# Patient Record
Sex: Female | Born: 1945 | Race: White | Hispanic: No | Marital: Married | Smoking: Former smoker
Health system: Southern US, Community
[De-identification: ages and names within clinical notes are randomized; demographics above are authoritative.]

## PROBLEM LIST (undated history)

## (undated) DIAGNOSIS — F32A Depression, unspecified: Secondary | ICD-10-CM

## (undated) DIAGNOSIS — I10 Essential (primary) hypertension: Secondary | ICD-10-CM

## (undated) DIAGNOSIS — F419 Anxiety disorder, unspecified: Secondary | ICD-10-CM

## (undated) DIAGNOSIS — M1990 Unspecified osteoarthritis, unspecified site: Secondary | ICD-10-CM

## (undated) DIAGNOSIS — I639 Cerebral infarction, unspecified: Secondary | ICD-10-CM

## (undated) DIAGNOSIS — I2510 Atherosclerotic heart disease of native coronary artery without angina pectoris: Secondary | ICD-10-CM

## (undated) DIAGNOSIS — G709 Myoneural disorder, unspecified: Secondary | ICD-10-CM

## (undated) DIAGNOSIS — N189 Chronic kidney disease, unspecified: Secondary | ICD-10-CM

## (undated) DIAGNOSIS — F329 Major depressive disorder, single episode, unspecified: Secondary | ICD-10-CM

## (undated) DIAGNOSIS — G4730 Sleep apnea, unspecified: Secondary | ICD-10-CM

## (undated) DIAGNOSIS — E119 Type 2 diabetes mellitus without complications: Secondary | ICD-10-CM

## (undated) DIAGNOSIS — R0602 Shortness of breath: Secondary | ICD-10-CM

## (undated) DIAGNOSIS — Z8489 Family history of other specified conditions: Secondary | ICD-10-CM

## (undated) HISTORY — PX: JOINT REPLACEMENT: SHX530

## (undated) HISTORY — PX: CARDIAC CATHETERIZATION: SHX172

## (undated) HISTORY — PX: CHOLECYSTECTOMY: SHX55

## (undated) HISTORY — PX: ABDOMINAL HYSTERECTOMY: SHX81

---

## 1997-12-04 ENCOUNTER — Ambulatory Visit (HOSPITAL_COMMUNITY): Admission: RE | Admit: 1997-12-04 | Discharge: 1997-12-04 | Payer: Self-pay | Admitting: Neurological Surgery

## 1997-12-04 ENCOUNTER — Encounter: Payer: Self-pay | Admitting: Neurological Surgery

## 1997-12-07 ENCOUNTER — Encounter: Admission: RE | Admit: 1997-12-07 | Discharge: 1998-03-07 | Payer: Self-pay | Admitting: Family Medicine

## 1998-11-10 ENCOUNTER — Ambulatory Visit (HOSPITAL_COMMUNITY): Admission: RE | Admit: 1998-11-10 | Discharge: 1998-11-10 | Payer: Self-pay | Admitting: Family Medicine

## 1998-11-10 ENCOUNTER — Encounter: Payer: Self-pay | Admitting: Family Medicine

## 1999-12-14 ENCOUNTER — Ambulatory Visit (HOSPITAL_COMMUNITY): Admission: RE | Admit: 1999-12-14 | Discharge: 1999-12-14 | Payer: Self-pay | Admitting: Neurological Surgery

## 1999-12-14 ENCOUNTER — Encounter: Payer: Self-pay | Admitting: Neurological Surgery

## 2000-12-18 ENCOUNTER — Ambulatory Visit (HOSPITAL_COMMUNITY): Admission: RE | Admit: 2000-12-18 | Discharge: 2000-12-18 | Payer: Self-pay | Admitting: Neurological Surgery

## 2000-12-18 ENCOUNTER — Encounter: Payer: Self-pay | Admitting: Neurological Surgery

## 2001-01-02 ENCOUNTER — Ambulatory Visit (HOSPITAL_COMMUNITY): Admission: RE | Admit: 2001-01-02 | Discharge: 2001-01-02 | Payer: Self-pay | Admitting: Otolaryngology

## 2001-01-02 ENCOUNTER — Encounter: Payer: Self-pay | Admitting: Otolaryngology

## 2001-07-23 ENCOUNTER — Encounter: Admission: RE | Admit: 2001-07-23 | Discharge: 2001-07-23 | Payer: Self-pay | Admitting: Family Medicine

## 2001-07-23 ENCOUNTER — Encounter: Payer: Self-pay | Admitting: Family Medicine

## 2001-07-26 ENCOUNTER — Encounter: Payer: Self-pay | Admitting: Family Medicine

## 2001-07-26 ENCOUNTER — Encounter: Admission: RE | Admit: 2001-07-26 | Discharge: 2001-07-26 | Payer: Self-pay | Admitting: Family Medicine

## 2001-09-13 ENCOUNTER — Encounter: Admission: RE | Admit: 2001-09-13 | Discharge: 2001-11-19 | Payer: Self-pay | Admitting: Family Medicine

## 2001-10-02 ENCOUNTER — Ambulatory Visit (HOSPITAL_BASED_OUTPATIENT_CLINIC_OR_DEPARTMENT_OTHER): Admission: RE | Admit: 2001-10-02 | Discharge: 2001-10-02 | Payer: Self-pay | Admitting: Family Medicine

## 2002-01-01 ENCOUNTER — Encounter: Admission: RE | Admit: 2002-01-01 | Discharge: 2002-01-01 | Payer: Self-pay

## 2002-01-28 ENCOUNTER — Encounter: Payer: Self-pay | Admitting: Family Medicine

## 2002-01-28 ENCOUNTER — Encounter: Admission: RE | Admit: 2002-01-28 | Discharge: 2002-01-28 | Payer: Self-pay | Admitting: Family Medicine

## 2002-06-12 ENCOUNTER — Encounter: Admission: RE | Admit: 2002-06-12 | Discharge: 2002-06-12 | Payer: Self-pay | Admitting: Family Medicine

## 2002-06-12 ENCOUNTER — Encounter: Payer: Self-pay | Admitting: Family Medicine

## 2002-10-28 ENCOUNTER — Encounter: Admission: RE | Admit: 2002-10-28 | Discharge: 2002-10-28 | Payer: Self-pay

## 2002-11-18 ENCOUNTER — Encounter: Payer: Self-pay | Admitting: Surgery

## 2002-11-21 ENCOUNTER — Encounter (INDEPENDENT_AMBULATORY_CARE_PROVIDER_SITE_OTHER): Payer: Self-pay | Admitting: *Deleted

## 2002-11-21 ENCOUNTER — Ambulatory Visit (HOSPITAL_COMMUNITY): Admission: RE | Admit: 2002-11-21 | Discharge: 2002-11-22 | Payer: Self-pay | Admitting: Surgery

## 2002-12-17 ENCOUNTER — Ambulatory Visit (HOSPITAL_COMMUNITY): Admission: RE | Admit: 2002-12-17 | Discharge: 2002-12-17 | Payer: Self-pay | Admitting: Neurological Surgery

## 2003-12-17 ENCOUNTER — Ambulatory Visit (HOSPITAL_COMMUNITY): Admission: RE | Admit: 2003-12-17 | Discharge: 2003-12-17 | Payer: Self-pay | Admitting: Neurological Surgery

## 2004-01-15 ENCOUNTER — Inpatient Hospital Stay (HOSPITAL_COMMUNITY): Admission: AD | Admit: 2004-01-15 | Discharge: 2004-01-17 | Payer: Self-pay | Admitting: Cardiovascular Disease

## 2004-03-24 ENCOUNTER — Encounter (HOSPITAL_COMMUNITY): Admission: RE | Admit: 2004-03-24 | Discharge: 2004-06-22 | Payer: Self-pay | Admitting: Cardiovascular Disease

## 2004-06-15 ENCOUNTER — Inpatient Hospital Stay (HOSPITAL_COMMUNITY): Admission: EM | Admit: 2004-06-15 | Discharge: 2004-06-17 | Payer: Self-pay | Admitting: Emergency Medicine

## 2004-06-23 ENCOUNTER — Encounter (HOSPITAL_COMMUNITY): Admission: RE | Admit: 2004-06-23 | Discharge: 2004-07-29 | Payer: Self-pay | Admitting: Cardiovascular Disease

## 2004-12-14 ENCOUNTER — Ambulatory Visit: Payer: Self-pay | Admitting: Physical Medicine & Rehabilitation

## 2004-12-14 ENCOUNTER — Inpatient Hospital Stay (HOSPITAL_COMMUNITY): Admission: RE | Admit: 2004-12-14 | Discharge: 2004-12-19 | Payer: Self-pay | Admitting: Orthopedic Surgery

## 2005-04-25 ENCOUNTER — Ambulatory Visit: Payer: Self-pay | Admitting: Cardiology

## 2005-04-26 ENCOUNTER — Inpatient Hospital Stay (HOSPITAL_COMMUNITY): Admission: EM | Admit: 2005-04-26 | Discharge: 2005-04-27 | Payer: Self-pay | Admitting: Emergency Medicine

## 2005-06-21 ENCOUNTER — Encounter: Admission: RE | Admit: 2005-06-21 | Discharge: 2005-06-21 | Payer: Self-pay | Admitting: Orthopedic Surgery

## 2005-07-06 ENCOUNTER — Encounter: Admission: RE | Admit: 2005-07-06 | Discharge: 2005-07-06 | Payer: Self-pay | Admitting: Orthopedic Surgery

## 2005-12-13 ENCOUNTER — Ambulatory Visit (HOSPITAL_COMMUNITY): Admission: RE | Admit: 2005-12-13 | Discharge: 2005-12-13 | Payer: Self-pay | Admitting: Neurological Surgery

## 2006-03-15 ENCOUNTER — Inpatient Hospital Stay (HOSPITAL_COMMUNITY): Admission: EM | Admit: 2006-03-15 | Discharge: 2006-03-16 | Payer: Self-pay | Admitting: Emergency Medicine

## 2006-10-04 ENCOUNTER — Ambulatory Visit (HOSPITAL_COMMUNITY): Admission: RE | Admit: 2006-10-04 | Discharge: 2006-10-05 | Payer: Self-pay | Admitting: Urology

## 2006-10-25 ENCOUNTER — Inpatient Hospital Stay (HOSPITAL_COMMUNITY): Admission: EM | Admit: 2006-10-25 | Discharge: 2006-10-29 | Payer: Self-pay | Admitting: Emergency Medicine

## 2007-02-05 ENCOUNTER — Encounter: Admission: RE | Admit: 2007-02-05 | Payer: Self-pay | Admitting: Physician Assistant

## 2007-05-02 ENCOUNTER — Encounter: Admission: RE | Admit: 2007-05-02 | Discharge: 2007-05-02 | Payer: Self-pay | Admitting: Family Medicine

## 2007-09-18 ENCOUNTER — Ambulatory Visit (HOSPITAL_COMMUNITY): Admission: RE | Admit: 2007-09-18 | Discharge: 2007-09-18 | Payer: Self-pay | Admitting: Gastroenterology

## 2007-12-19 ENCOUNTER — Ambulatory Visit (HOSPITAL_COMMUNITY): Admission: RE | Admit: 2007-12-19 | Discharge: 2007-12-19 | Payer: Self-pay | Admitting: Neurological Surgery

## 2008-04-07 ENCOUNTER — Observation Stay (HOSPITAL_COMMUNITY): Admission: EM | Admit: 2008-04-07 | Discharge: 2008-04-08 | Payer: Self-pay | Admitting: Emergency Medicine

## 2008-04-07 HISTORY — PX: CARDIAC CATHETERIZATION: SHX172

## 2009-02-09 ENCOUNTER — Encounter: Admission: RE | Admit: 2009-02-09 | Discharge: 2009-02-09 | Payer: Self-pay | Admitting: Internal Medicine

## 2009-03-08 ENCOUNTER — Inpatient Hospital Stay (HOSPITAL_COMMUNITY): Admission: RE | Admit: 2009-03-08 | Discharge: 2009-03-10 | Payer: Self-pay | Admitting: Orthopedic Surgery

## 2010-04-24 LAB — COMPREHENSIVE METABOLIC PANEL
ALT: 35 U/L (ref 0–35)
AST: 25 U/L (ref 0–37)
CO2: 29 mEq/L (ref 19–32)
Chloride: 101 mEq/L (ref 96–112)
Creatinine, Ser: 1.34 mg/dL — ABNORMAL HIGH (ref 0.4–1.2)
GFR calc Af Amer: 48 mL/min — ABNORMAL LOW (ref >60)
GFR calc non Af Amer: 40 mL/min — ABNORMAL LOW (ref >60)
Glucose, Bld: 112 mg/dL — ABNORMAL HIGH (ref 70–99)
Sodium: 139 mEq/L (ref 135–145)
Total Bilirubin: 0.4 mg/dL (ref 0.3–1.2)

## 2010-04-24 LAB — ABO/RH: ABO/RH(D): O NEG

## 2010-04-24 LAB — URINE CULTURE
Colony Count: NO GROWTH
Culture: NO GROWTH

## 2010-04-24 LAB — URINALYSIS, ROUTINE W REFLEX MICROSCOPIC
Glucose, UA: NEGATIVE mg/dL
Hgb urine dipstick: NEGATIVE
Ketones, ur: NEGATIVE mg/dL
Nitrite: NEGATIVE
Protein, ur: NEGATIVE mg/dL
Specific Gravity, Urine: 1.025 (ref 1.005–1.030)
Urobilinogen, UA: 1 mg/dL (ref 0.0–1.0)
pH: 5.5 (ref 5.0–8.0)

## 2010-04-24 LAB — GLUCOSE, CAPILLARY
Glucose-Capillary: 103 mg/dL — ABNORMAL HIGH (ref 70–99)
Glucose-Capillary: 133 mg/dL — ABNORMAL HIGH (ref 70–99)
Glucose-Capillary: 137 mg/dL — ABNORMAL HIGH (ref 70–99)
Glucose-Capillary: 168 mg/dL — ABNORMAL HIGH (ref 70–99)

## 2010-04-24 LAB — CBC
Hemoglobin: 14.8 g/dL (ref 12.0–15.0)
MCV: 85.4 fL (ref 78.0–100.0)
RBC: 5.02 MIL/uL (ref 3.87–5.11)
WBC: 7.5 10*3/uL (ref 4.0–10.5)

## 2010-04-24 LAB — DIFFERENTIAL
Basophils Absolute: 0 10*3/uL (ref 0.0–0.1)
Eosinophils Absolute: 0.2 10*3/uL (ref 0.0–0.7)
Eosinophils Relative: 2 % (ref 0–5)
Neutrophils Relative %: 57 % (ref 43–77)

## 2010-04-24 LAB — APTT: aPTT: 27 seconds (ref 24–37)

## 2010-04-24 LAB — TYPE AND SCREEN
ABO/RH(D): O NEG
Antibody Screen: NEGATIVE

## 2010-04-24 LAB — PROTIME-INR: Prothrombin Time: 13.6 seconds (ref 11.6–15.2)

## 2010-04-27 LAB — CBC
Hemoglobin: 10.2 g/dL — ABNORMAL LOW (ref 12.0–15.0)
Hemoglobin: 9.5 g/dL — ABNORMAL LOW (ref 12.0–15.0)
MCHC: 33.1 g/dL (ref 30.0–36.0)
MCHC: 34.1 g/dL (ref 30.0–36.0)
MCV: 84.8 fL (ref 78.0–100.0)
MCV: 87.2 fL (ref 78.0–100.0)
RBC: 3.53 MIL/uL — ABNORMAL LOW (ref 3.87–5.11)
RDW: 14.3 % (ref 11.5–15.5)

## 2010-04-27 LAB — GLUCOSE, CAPILLARY
Glucose-Capillary: 110 mg/dL — ABNORMAL HIGH (ref 70–99)
Glucose-Capillary: 125 mg/dL — ABNORMAL HIGH (ref 70–99)
Glucose-Capillary: 128 mg/dL — ABNORMAL HIGH (ref 70–99)
Glucose-Capillary: 132 mg/dL — ABNORMAL HIGH (ref 70–99)

## 2010-04-27 LAB — BASIC METABOLIC PANEL
CO2: 29 mEq/L (ref 19–32)
CO2: 30 mEq/L (ref 19–32)
Calcium: 8.4 mg/dL (ref 8.4–10.5)
Chloride: 100 mEq/L (ref 96–112)
Chloride: 102 mEq/L (ref 96–112)
Creatinine, Ser: 1.15 mg/dL (ref 0.4–1.2)
GFR calc Af Amer: >60 mL/min (ref >60)
Glucose, Bld: 124 mg/dL — ABNORMAL HIGH (ref 70–99)
Potassium: 4.2 mEq/L (ref 3.5–5.1)
Sodium: 134 mEq/L — ABNORMAL LOW (ref 135–145)
Sodium: 136 mEq/L (ref 135–145)

## 2010-04-27 LAB — PROTIME-INR: INR: 1.71 — ABNORMAL HIGH (ref 0.00–1.49)

## 2010-05-19 LAB — COMPREHENSIVE METABOLIC PANEL
ALT: 24 U/L (ref 0–35)
AST: 20 U/L (ref 0–37)
Albumin: 3.9 g/dL (ref 3.5–5.2)
Alkaline Phosphatase: 43 U/L (ref 39–117)
Chloride: 104 mEq/L (ref 96–112)
GFR calc Af Amer: 57 mL/min — ABNORMAL LOW (ref >60)
Potassium: 3.8 mEq/L (ref 3.5–5.1)
Sodium: 138 mEq/L (ref 135–145)
Total Bilirubin: 0.5 mg/dL (ref 0.3–1.2)
Total Protein: 6.7 g/dL (ref 6.0–8.3)

## 2010-05-19 LAB — CBC
HCT: 39.2 % (ref 36.0–46.0)
Hemoglobin: 13.6 g/dL (ref 12.0–15.0)
RBC: 4.85 MIL/uL (ref 3.87–5.11)
RDW: 16.3 % — ABNORMAL HIGH (ref 11.5–15.5)
WBC: 6 10*3/uL (ref 4.0–10.5)

## 2010-05-19 LAB — DIFFERENTIAL
Basophils Absolute: 0 10*3/uL (ref 0.0–0.1)
Eosinophils Relative: 3 % (ref 0–5)
Lymphocytes Relative: 39 % (ref 12–46)
Lymphs Abs: 2.3 10*3/uL (ref 0.7–4.0)
Neutro Abs: 2.9 10*3/uL (ref 1.7–7.7)
Neutrophils Relative %: 49 % (ref 43–77)

## 2010-05-19 LAB — PROTIME-INR
INR: 1 (ref 0.00–1.49)
Prothrombin Time: 13.4 seconds (ref 11.6–15.2)

## 2010-05-19 LAB — TROPONIN I: Troponin I: <0.01 ng/mL (ref 0.00–0.06)

## 2010-05-19 LAB — GLUCOSE, CAPILLARY: Glucose-Capillary: 311 mg/dL — ABNORMAL HIGH (ref 70–99)

## 2010-05-19 LAB — TSH: TSH: 1.535 u[IU]/mL (ref 0.350–4.500)

## 2010-05-19 LAB — APTT: aPTT: 26 seconds (ref 24–37)

## 2010-05-19 LAB — CK TOTAL AND CKMB (NOT AT ARMC)
CK, MB: 2.1 ng/mL (ref 0.3–4.0)
Total CK: 98 U/L (ref 7–177)

## 2010-06-21 NOTE — Cardiovascular Report (Signed)
[name], [name] NO.:  [id number]   MEDICAL RECORD NO.:  [id number]          PATIENT TYPE:  INP   LOCATION:  4738                         FACILITY:  [facility]   PHYSICIAN:  Darlin Priestly, MD  DATE OF BIRTH:  1945/08/28   DATE OF PROCEDURE:  10/29/2006  DATE OF DISCHARGE:                            CARDIAC CATHETERIZATION   PROCEDURES:  1. Left heart catheterization.  2. Coronary angiography.  3. Left ventriculogram.   ATTENDING:  Dr. Lenise Herald   COMPLICATIONS:  None.   INDICATION:  [name] is a 65 year old female patient of Dr. Smith Mince  and Dr. Nanetta Batty with a history of ongoing tobacco use, history of  diabetes, history of RCA stenting in 2005 with scattered noncritical  disease of the circumflex and LAD.  She was readmitted on October 25, 2006 with recurrent chest pain which happened with exertion throughout  her hospital stay.  She is now brought for repeat catheterization to  reassess her coronary artery disease.   DESCRIPTION OF OPERATION:  After obtaining informed consent, the patient  was brought to the cardiac cath lab where the right groin was shaved,  prepped and draped in a sterile fashion.  Anesthesia monitor  established.  Utilizing the modified Seldinger technique, a #6  intraarterial sheath was inserted into the right femoral artery.  A 6-  [place] diagnostic catheter was then used to perform diagnostic  angiography.   The left main is a large vessel with no evidence of disease.   The LAD is a medium-to-large vessel which courses to gives rise to two  diagonal branches.  LAD has mild 30% proximal and 30% mid-vessel  stenosis, but no high-grade lesions.   First and second diagonals are small-to-medium-size vessels with no  significant disease.   The left coronary artery also gives rise to a medium-size ramus  intermedius which bifurcates distally with no significant disease.   The left circumflex is a medium-size  vessel which courses to the AV  groove and gives rise to one obtuse marginal branch.  In the AV groove  circumflex, there is no significant disease.   The first OM is a small-to-medium size vessel which bifurcates distally  with no significant disease.   The right coronary artery is a large vessel which is dominant and gives  rise to both PDA and posterolateral branch.  There is a stent noted in  the distal portion of the RCA.  The mid-RCA has a diffuse 50% long  stenotic lesion which is essentially unchanged.  There is a patent stent  in the distal RCA.  The PDA has no significant disease.   The PLA is a medium-size vessel with a 50% lesion in the mid and distal  segment.   The left ventriculogram reveals a mildly depressed EF of 45% with mild  anterolateral hypokinesis.   HEMODYNAMIC SYSTEM:  Systemic arterial pressure 140/72, LV systemic  pressure 140/8, LVEDP of 14.   CONCLUSION:  1. No significant coronary artery disease with widely patent distal      RCA stent.  2. Mildly  depressed left ventricular systolic function.      Darlin Priestly, MD  Electronically Signed     RHM/MEDQ  D:  10/29/2006  T:  10/29/2006  Job:  [id number]   cc:   Nanetta Batty, M.D.  Talmadge Coventry, M.D.

## 2010-06-21 NOTE — Cardiovascular Report (Signed)
[name], Janet NO.:  [id number]   MEDICAL RECORD NO.:  [id number]          PATIENT TYPE:  OBV   LOCATION:  4729                         FACILITY:  [facility]   PHYSICIAN:  Nanetta Batty, M.D.   DATE OF BIRTH:  April 19, 1945   DATE OF PROCEDURE:  04/07/2008  DATE OF DISCHARGE:                            CARDIAC CATHETERIZATION   HISTORY:  [name] is a 65 year old mildly overweight married white  female with history of CAD status post RCA stenting by myself on  January 15, 2004 with a Liberte 4.5 x 16 mm bare metal stent.  Other  problems include history of middle cerebral artery aneurysm which is  stable, hypertension, hyperlipidemia, non-insulin requiring diabetes,  and continued tobacco abuse.  Her last cath performed Dr. Jenne Campus on  October 29, 2006 revealed a widely patent stent.  She came to the ER  today complaining of crescendo angina.  Her enzymes were negative.  Her  EKG showed no acute changes.  She was put on IV heparin and nitro and  presents now for diagnostic coronary arteriography to define her anatomy  and rule out ischemic etiology.   DESCRIPTION OF PROCEDURE:  The patient was brought to the Second Floor  [facility] in the postabsorptive state.  She was not  premedicated.  Her right groin was prepped and shaved in the usual  sterile fashion.  Xylocaine 1% was used for local anesthesia.  A 6-  French sheath was inserted into the right femoral artery using standard  Seldinger technique.  A 6-French right and left Judkins diagnostic  catheter as well as 6-French pigtail catheter were used for selective  cholangiography and left ventriculography respectively.  Visipaque dye  was used for the entirety of the case.  Retrograde aortic, left  ventricular, and pullback pressures were recorded.   HEMODYNAMICS:  1. Aortic systolic pressure 167, diastolic pressure 78.  2. Left ventricular systolic pressure 167, end-diastolic pressure  11.   SELECTIVE CHOLANGIOGRAPHY:  1. Left main normal.  2. LAD; LAD had approximately 30% proximal stenosis at the takeoff of      the first septal perforator.  3. Left circumflex; nondominant and free of significant disease.  4. Ramus branch; moderate in size and free of significant disease.  5. Right coronary; dominant with a widely patent stent in its      midportion.  There was a 40% segmental stenosis just proximal to      the stented segment.  6. Left ventriculography; RAO left ventriculogram was performed using      25 mL of Visipaque dye at 12 mL per second.  The overall LVEF was      estimated to be greater than 60% without focal wall motion      abnormalities.   IMPRESSION:  [name] has widely patent stent with noncritical coronary  artery disease, unchanged from last catheterization, October 29, 2006.  I believe her chest pain is noncardiac.  She will be treated empirically  with antireflux measures.   The ACT was measured and the sheath was removed.  Pressure was held on  the groin to achieve hemostasis.  The patient left the lab in stable  condition.      Nanetta Batty, M.D.  Electronically Signed     JB/MEDQ  D:  04/07/2008  T:  04/08/2008  Job:  [id number]   cc:   Second Floor [facility]  [facility]

## 2010-06-21 NOTE — Discharge Summary (Signed)
[name], MISHKIN NO.:  [id number]   MEDICAL RECORD NO.:  [id number]          PATIENT TYPE:  OBV   LOCATION:  4729                         FACILITY:  [facility]   PHYSICIAN:  Nanetta Batty, M.D.   DATE OF BIRTH:  02-Dec-1945   DATE OF ADMISSION:  04/07/2008  DATE OF DISCHARGE:  04/08/2008                               DISCHARGE SUMMARY   DISCHARGE DIAGNOSES:  1. Chest pain, worrisome for unstable angina, catheterization in this      admission showing no progression of coronary artery disease.  2. Coronary artery disease with remote bare-metal stenting to the      right coronary artery in December 2005, with multiple relook since,      the last one being this admission.  3. Preserved left ventricular function with an ejection fraction of 45-      50%.  4. Treated dyslipidemia.  5. Non-insulin-dependent diabetes.  6. Treated hypertension.  7. History of smoking.  8. History of depression and anxiety, currently under some social      stress.  9. Degenerative disk disease in her lower back followed by Dr. Danielle Dess.  10.Known middle cerebral artery aneurysm followed by Dr. Danielle Dess, which      has been stable.  11.Incidental finding of her right frontal cerebrovascular accident by      CT recently without symptoms.  12.Possible transient ischemic attack with a negative workup in March      2007.   HOSPITAL COURSE:  Ms. Goldberger is a 65 year old female followed by Dr.  Allyson Sabal and Dr. Thayer Headings.  She was cath'd in December 2005, and  underwent a bare-metal stenting to a 70-80% RCA stenosis, she had no  other significant disease.  She had a followup nuclear study later in  December of that same year that was negative for ischemia.  In May 2006,  she was restudied for recurrent chest pain and the site was patent.  In  March 2007, she was admitted with a TIA.  In February 2008, she was  restudied again for chest pain and had a 40-50% narrowing in the RCA  proximal of  the stent, but otherwise no significant coronary artery  disease.  In September 2008, she was studied once again for chest pain.  The RCA stenosis was unchanged.  There was also noted to be a 50% PLA,  but no critical disease and no in-stent restenosis.  She was admitted to  the emergency room on April 07, 2008, with complaints of substernal chest  pressure that had been going on for a couple of days.  She also said she  had some tingling and numbness in her left arm.  She took a  nitroglycerin with some relief of her symptoms.  She called the office  and was sent to the emergency room.  She admits to being under a lot of  stress, apparently she is currently in a custody battle trying to get  control of one of her grandchildren.  The patient was seen by Dr. Allyson Sabal  and myself in the emergency room.  She was admitted to telemetry and  started on heparin and nitrates.  Dr. Allyson Sabal felt the best way to  evaluate her further was with diagnostic catheterization and she was  taken to the cath lab later on April 07, 2008.  This revealed no in-stent  restenosis of the RCA, 40% proximal narrowing in the RCA to the stent  and no significant disease in the left system and normal LV function.  She tolerated the procedure well.  We feel, she can be discharged on  April 08, 2008.   LABORATORIES:  TSH is 1.53 and INR 1.0.  Troponin is negative.  Sodium  138, potassium 3.8, BUN 19, creatinine 1.16.  LFTs are normal.  White  count 6.0, hemoglobin 13.6, hematocrit 39.2, and platelets 286.  EKG  shows sinus rhythm without acute changes.   DISCHARGE MEDICATIONS:  These were reviewed in detail with the patient.  They are as follows, the patient takes:  1. Alprazolam 1 mg nightly and 0.5 mg t.i.d. p.r.n.  2. She takes BuSpar 40 mg twice a day, this is higher than the      recommended dose and we have asked her to check with Dr. Madaline Guthrie      about this.  She also takes clonazepam 0.5 mg b.i.d. and 1 mg       nightly.  3. Advair Diskus has been increased to twice a day.  4. She takes Astelin spray 2 puffs in each nostril p.r.n.  5. Atenolol 100 mg twice a day.  6. Effexor 37.5 mg 3 tablets daily.  7. Wellbutrin 150 mg a day.  8. Hydrochlorothiazide 25 mg a day.  9. Lipitor 40 mg a day.  10.Lovaza 1 g b.i.d.  11.TriCor 145 mg a day.  12.Singulair 10 mg a day.  13.Ramipril 20 mg a day.  14.Folic acid 1 mg a day.  15.Plavix 75 mg a day.  16.Imdur 60 mg a day.  17.Aspirin 81 mg a day.  18.Nitroglycerin 0.4 mg as needed.  19.We have asked her to also start Pepcid AC 20 mg daily.   She will hold off on her Janumet until Friday, this was 50/1 g b.i.d.   DISPOSITION:  The patient is discharged in stable condition.  She will  follow up with Dr. Thea Silversmith.  She knows to call Dr. Corinna Lines office  and ask about the BuSpar dosing.  She says, she has been on this dose  for quite some time.  She will see Dr. Allyson Sabal back in a few weeks.  We  did counsel her about smoking, she has been on Chantix in the past, but  says she can afford this and actually is not interested in quitting at  this time.      Abelino Derrick, P.A.      Nanetta Batty, M.D.  Electronically Signed    LKK/MEDQ  D:  04/08/2008  T:  04/08/2008  Job:  [id number]   cc:   Clyde Canterbury, MD  Dr. Ronne Binning

## 2010-06-21 NOTE — Op Note (Signed)
[name], [name]                   ACCOUNT NO.:  [id number]   MEDICAL RECORD NO.:  [id number]          PATIENT TYPE:  OIB   LOCATION:  0098                         FACILITY:  [facility]   PHYSICIAN:  Sigmund I. Patsi Sears, M.D.DATE OF BIRTH:  06/22/1945   DATE OF PROCEDURE:  DATE OF DISCHARGE:                               OPERATIVE REPORT   PREOPERATIVE DIAGNOSIS:  Stress urinary incontinence.   POSTOPERATIVE DIAGNOSIS:  Stress urinary incontinence.   PROCEDURE PERFORMED:  Cystoscopy, suprapubic tension-free vaginal tape.   SURGEON:  Sigmund I. Patsi Sears, M.D.   ASSISTANT:  Melina Schools, M.D.   ANESTHESIA:  General.   INDICATIONS FOR PROCEDURE:  [name] is a 65 year old female with a  history of stress urinary incontinence.  Her bother is significant.  She  has no urge symptoms.  She has been tried on conservative management,  include Kegel exercises, and continues to have significant stress  urinary incontinence symptoms and desires operative movement.   DESCRIPTION OF PROCEDURE IN DETAIL:  The patient was brought to the  operating room.  She was identified by his arm band.  Informed consent  was verified, and a preoperative time-out was performed.  Preoperative  antibiotics were administered.  After the successful induction of  general endotracheal anesthesia, the patient was moved to the dorsal  lithotomy position.  All appropriate pressure points were padded to  avoid apraxia or compartment syndrome.  Sequential compression devices  were employed.  The perineum was prepped and draped in the usual  fashion.  A Foley catheter was inserted transurethrally into the  bladder.  A weighted speculum was placed into the vagina.  A T clamp was  placed in the mid urethra.  A 3 cm incision was made in the anterior  vaginal wall overlying the mid urethra.  Strully scissors were used to  develop the plane.  We then perforated the endopelvic fascia posterior  to the pubis.  We then made  stab incisions overlying the pubic bone in  the suprapubic location.  We then used the Linx sling device.  The  curved passers were passed posterior to the pubis into our dissection.  We then removed the Foley catheter and performed cystoscopy with 70 and  30 degree lenses.  Bilateral ureteral orifices were noted to be in  normal anatomic position.  There was no evidence of perforation or  submucosal passage of the needles.  We then attached the sling and  retracted the needles.  Right angle was used to assure adequate tension  on the tape.  We then removed the sheath.  We then amputated the excess  tape.  The wound was copiously irrigated.  A pack was placed into the  vagina.  The Foley catheter was left to straight drain.  The mucosa was  closed with running 2-0 Vicryl.  Dermabond was used to close the  suprapubic stab incisions.  At this time, the procedure was terminated.  The patient tolerated the procedure well, and there were no  complications.   Dr. Jethro Bolus was the attending primary responsible physician  and  was present and participated in all aspects of the procedure.   DISPOSITION:  Patient was awakened from anesthesia and transferred  safely to the PACU.  She will be admitted for 23-hour observation.      Terie Purser, MD      Sigmund I. Patsi Sears, M.D.  Electronically Signed    JH/MEDQ  D:  10/04/2006  T:  10/05/2006  Job:  [id number]

## 2010-06-21 NOTE — H&P (Signed)
[name], [name] NO.:  [id number]   MEDICAL RECORD NO.:  [id number]          PATIENT TYPE:  OBV   LOCATION:  1823                         FACILITY:  [facility]   PHYSICIAN:  [name], M.D.   DATE OF BIRTH:  1945/11/20   DATE OF ADMISSION:  04/07/2008  DATE OF DISCHARGE:                              HISTORY & PHYSICAL   CHIEF COMPLAINT:  Chest pain.   HISTORY OF PRESENT ILLNESS:  Ms. [name] is a 65 year old female followed  by Dr. Allyson [name].  She had a catheterization electively in December 2005 and  underwent Liberte bare metal stenting to a 70-80% RCA stenosis.  She had  a followup nuclear study later in December that was negative for  ischemia.  She had recurrent chest pain in May 2006 and was restudied  and the site was patent.  She also had a CT that ruled out pulmonary  embolism during that admission.  In March 2007 she was admitted with  transient left facial numbness, a workup was negative at that time.  In  February 2008 she was restudied again for chest pain.  She had a 40-50%  RCA narrowing proximal to the stent but otherwise no significant  stenosis.  In September 2008 she was studied again for chest pain.  This  revealed a 50% RCA and a 50% PLA but no significant in-stent restenosis  of the RCA stent and no other disease on the left.  It has been about a  year since she saw Dr. Allyson [name] last.  She is actually done well from a  cardiac standpoint until recently.  She says about 4 days ago she  started having substernal chest pressure.  Her symptoms have been off  and on.  The last day or so she has had some tingling and numbness in  her left arm and some discomfort up into her left jaw.  She called the  office today and the nurse asked her to take a nitroglycerin and the  patient says that it has helped her symptoms.  The patient admits to  being under a lot of stress recently, she is currently in a custody  battle trying to get control of one of her  grandchildren.   PAST MEDICAL HISTORY:  Remarkable for severe anxiety and depression, she  has been followed by a psychiatrist in the past.  She has a history of  DJD and has been followed by Dr. Danielle [name], recently she has had some low  back issues.  She has had a previous right total knee replacement in  November 2006.  She does have a history of a bladder prolapse and had a  cystoscopy in August 2008.  She has non-insulin-dependent diabetes and  hypertension and dyslipidemia.  She has a middle cerebral artery  aneurysm that has been unchanged in 12 years, this is followed by Dr.  Danielle [name].  She has had a previous laparoscopic cholecystectomy in October  2004.   CURRENT MEDICATIONS:  1. Alprazolam 1 mg q.8 h. p.r.n.  2. BuSpar 40 mg h.s.  3. Clonazepam 0.5  mg b.i.d. and 1 mg h.s.  4. Advair Diskus 250 mcg daily.  5. Astelin spray 2 puffs in each nostril p.r.n.  6. Atenolol 50 mg q.h.s.  7. Effexor 37.5 mg 3 tablets daily.  8. Wellbutrin 150 mg 1 tablet daily.  9. Janumet 50/1 g b.i.d.  10.HCTZ 25 mg a day.  11.Lipitor 40 mg a day.  12.Lovaza 1 g b.i.d.  13.Tricor 145 mg a day.  14.Singulair 10 mg a day.  15.Ramipril 20 mg a day.  16.Folic acid daily.  17.Plavix 75 mg a day.  18.Imdur 60 mg a day.  19.Aspirin 81 mg a day.   She reportedly is allergic to [facility] and MACRODANTIN.   SOCIAL HISTORY:  She is married.  She has 3 children and 2  grandchildren.  She continues to smoke about a half-pack a day.   FAMILY HISTORY:  Family history remarkable that her brother had bypass  surgery.   REVIEW OF SYSTEMS:  The patient recently had a CT of her brain November  2009 that showed a new area of right frontal stroke.  The aneurysm is  stable.  She has recently had some low back problems and has seen Dr.  Danielle [name].  She has had a negative mammogram in March 2009.  She had a  negative gastric emptying scan in August 2009.  She denies any GI  bleeding or melena.  She had not had recent  fever or chills.   PHYSICAL EXAMINATION:  Blood pressure 156/76, pulse 67, respirations 12.  GENERAL:  She is a well-developed, well-nourished female in no acute  distress.  HEENT: Normocephalic.  She wears glasses.  NECK:  Without JVD or bruit.  CHEST:  Clear to auscultation and percussion.  CARDIAC:  Reveals regular rate and rhythm without murmur, rub or gallop.  Normal S1, S2.  ABDOMEN:  Nontender.  No hepatosplenomegaly.  EXTREMITIES:  Without edema.  Distal pulses are 1-2+ bilaterally.  There  are no femoral artery bruits noted.  NEUROLOGIC:  Grossly intact.  She is awake, alert and oriented and  cooperative.  Moves all extremities without obvious deficit.  SKIN:  Warm and dry.   EKG showed sinus rhythm, sinus bradycardia, without acute changes.   IMPRESSION:  1. Chest pain consistent with unstable angina.  2. Known coronary disease as described above with remote bare metal      stenting to the right coronary artery January 15, 2004, with      multiple re-looks since showing moderate progression of disease but      no significant in-stent restenosis, last catheterization was      September 2008.  3. Preserved left ventricular function with an ejection fraction of 45-      50%.  4. Treated dyslipidemia.  5. Noninsulin-dependent diabetes.  6. Treated hypertension.  7. Smoking.  8. Severe depression and anxiety with current situational stress.  9. Degenerative joint disease of the lower back.  10.Known middle cerebral artery aneurysm followed by Dr. Danielle [name], this      has been stable.  11.Right frontal cerebrovascular accident by CT, unknown onset.  12.Possible transient ischemic attack March 2007.   PLAN:  The patient will be seen by Dr. Allyson [name].  It is possible we may be  able to do her catheterization today; if not, we will do it tomorrow.  She will need treatment for unstable angina, IV heparin and nitrates.      [name], P.A.      [name], M.D.  Electronically Signed    LKK/MEDQ  D:  04/07/2008  T:  04/07/2008  Job:  [id number]

## 2010-06-24 NOTE — Op Note (Signed)
[name], [name] NO.:  [id number]   MEDICAL RECORD NO.:  [id number]          PATIENT TYPE:  INP   LOCATION:  2899                         FACILITY:  [facility]   PHYSICIAN:  Elana Alm. Thurston Hole, M.D. DATE OF BIRTH:  1945-03-29   DATE OF PROCEDURE:  12/14/2004  DATE OF DISCHARGE:                                 OPERATIVE REPORT   PREOPERATIVE DIAGNOSIS:  Right knee degenerative joint disease.   POSTOPERATIVE DIAGNOSIS:  Right knee degenerative joint disease.   PROCEDURE:  Right total knee replacement using DePuy cemented total knee  system with #3 cemented femur, #3 cemented tibia, with 10 mm polyethylene RP  tibial spacer and 32 mm polyethylene cemented patella.   SURGEON:  Elana Alm. Thurston Hole, M.D.   ASSISTANT:  Julien Girt, P.A.   ANESTHESIA:  General.   OPERATIVE TIME:  One hour 40 minutes.   COMPLICATIONS:  None.   DESCRIPTION OF PROCEDURE:  Ms. Polack was brought to operating room on  December 14, 2004, after a femoral nerve block had been placed in the  holding room by anesthesia. She was placed on operating table supine  position. After being placed under general anesthesia, she had a Foley  catheter placed under sterile conditions and received Ancef 1 g IV  preoperatively for prophylaxis.  Her right knee was examined. She had range  of motion from -8 to 125 degrees with moderate varus deformity, knee stable  ligamentous exam.  Her right leg was then prepped using sterile DuraPrep and  draped using sterile technique. Originally, the leg was exsanguinated and a  thigh tourniquet elevated 375 mm. Initially through a 15 cm longitudinal  incision based over the patella, initial exposure was made.  The underlying  subcutaneous tissues were incised along with skin incision. A median  arthrotomy was performed revealing an excessive amount of normal-appearing  joint fluid. The articular surfaces were inspected. She had grade IV changes  medially, grade  III changes laterally, grade III and IV changes in the  patellofemoral joint. The medial and lateral meniscal remnants were removed  as well as the anterior cruciate ligament. Osteophytes were moved off the  femoral condyles and tibial plateaus. An intramedullary drill was then  drilled up the femoral canal for placement of the distal femoral cutting jig  which was placed in the appropriate manner rotation and a distal 12 mm cut  was made. The distal femur was incised. A #3 was found be the appropriate  size. A #3 cutting jig was placed and then these cuts were made. The  proximal tibia was then exposed. The tibial spines were removed with an  oscillating saw. Intramedullary drill was drilled down the tibial canal for  placement of the proximal tibial cutting jig which was placed in the  appropriate amount of rotation and a proximal 6 mm cut was made based off  the medial or lower side. After this was done, then spacer blocks were  placed in flexion and extension, 10 mm blocks gave excellent stability and  excellent balancing. At this point, the proximal tibia was  again exposed and  the #3 tibial base plate was placed in the keel cut was made. After this was  done, then the femoral PCL box cutter was placed on the distal femur and  these cuts were made. After this was done, #3 femoral trial was placed, the  #3 tibial base plate trial was placed with a 10 mm polyethylene RP tibial  spacer placed, the knee was reduced, taken through range of motion from 0 to  125 degrees with excellent stability, excellent correction of her flexion  and varus deformity. The patella was incised. The patella measured 21 mm in  depth and a resurfacing 10 mm cut was made and three locking holes were  placed. A 32 mm polyethylene patella trial was placed. Patellofemoral  tracking was evaluated and this was found to be normal.  At this point, it  was felt that all the trial components were of excellent size, fit  and  stability. They were then removed.  The knee was then jet lavage irrigated  with 3 liters of saline solution. The proximal tibia was then exposed and a  #3 tibial baseplate with cement backing was hammered into position with an  excellent fit with excess cement being removed from around the edges.  The  #3 femoral component with cement backing was hammered into position, also  with an excellent fit with excess cement being removed from around the  edges. The 10 mm polyethylene RP tibial spacer was then placed on tibial  baseplate. The knee reduced, taken through range of motion 0 to 125 degrees  with excellent stability, excellent correction of her flexion and varus  deformities.  The 32 mm polyethylene cement backed patella was then placed  into its position and held there with a clamp. After the cement hardened,  patellofemoral tracking was again evaluated and this was found to be normal.  At this point, it was felt that all components were of excellent size, fit  and stability. The wound was further irrigated, the tourniquet was released.  Hemostasis was obtained with cautery. The arthrotomy was then closed with #1  Ethibond suture over two medium Hemovac drains. The subcutaneous tissues  closed with 0 and 2-0 Vicryl, subcuticular layer closed with 4-0 Monocryl.  Steri-Strips were applied. Sterile dressings were applied. Hemovac injected  with 0.25% Marcaine with epinephrine and 4 mg of morphine.  The patient then  awakened, extubated and taken to recovery room in stable condition. Needle  and sponge counts correct x2 at the end of the case.      Robert A. Thurston Hole, M.D.  Electronically Signed     RAW/MEDQ  D:  12/14/2004  T:  12/14/2004  Job:  [id number]

## 2010-06-24 NOTE — Discharge Summary (Signed)
[name], [name] NO.:  [id number]   MEDICAL RECORD NO.:  [id number]          PATIENT TYPE:  INP   LOCATION:  3735                         FACILITY:  [facility]   PHYSICIAN:  Nanetta Batty, M.D.   DATE OF BIRTH:  06-23-1945   DATE OF ADMISSION:  03/15/2006  DATE OF DISCHARGE:  03/16/2006                               DISCHARGE SUMMARY   DISCHARGE DIAGNOSES:  1. Chest pain consistent with angina  2. Coronary artery disease, with previous right coronary artery      stenting in December 2005, catheterization this admission revealing      a patent right coronary artery stent site, with normal left      anterior descending and circumflex.  3. History of smoking.  4. Non-insulin-dependent diabetes.  5. Dyslipidemia.   HOSPITAL COURSE:  The patient is a 65 year old female followed by Dr.  Allyson Sabal with a history of coronary disease.  She had an RCA stent in  December 2005.  She was studied in May 2006 and had a patent site.  She  has had a previous TIA.  Her last stress test was in March 2007 and was  negative for ischemia.  Two or three  days prior to admission, she had  had chest discomfort with associated shortness of breath, worse with  exertion.  She was admitted March 15, 2006 for further evaluation.  The patient was seen by Dr. Allyson Sabal.  She was started on nitrates and  heparin.  Enzymes were negative.  She was set up for diagnostic  catheterization, which was done March 16, 2006.  This revealed a  normal LAD, normal circumflex, 40% RCA proximal to a patent stent in the  mid distal vessel.  She tolerated this well.  We felt she could be  discharged later on March 16, 2006.   DISCHARGE MEDICATIONS:  1. Wellbutrin XL 150 mg a day.  2. Effexor XR 37.5 mg a day.  3. BuSpar 2 pills daily.  4. Alprazolam 1 mg at bedtime.  5. Altace 10 mg a day.  6. Atenolol 100 mg twice a day.  7. HCTZ 12.5 mg a day.  8. TriCor 145 daily.  9. AcipHex 20 mg a day.  10.Plavix 75 mg a day.  11.Singulair 10 mg a day.  12.Advair 2 puffs b.i.d.  13.Lipitor 20 mg a day.  14.Janumet b.i.d.  15.Clonazepam 1 mg 3 times a day.  16.Aspirin 325 mg a day.  17.Lovaza twice a day.   LABORATORIES:  EKG showed sinus rhythm, with poor anterior R-wave  progression, and nonspecific ST changes.  Chest x-ray shows low volumes,  no acute disease.  White count at discharge is 3.9, with a hemoglobin of  13.3, hematocrit 39.7, platelets 333.  INR 1.1.  Sodium 135, potassium  4, BUN 18, creatinine 1.  Liver functions show an AST of 27, ALT of 47.  CK-MB and troponins were negative x3.  Lipid profile shows a cholesterol  of 183, LDL 110, HDL 28. Hemoglobin A1c was 6.3.  TSH 1.5.  Urinalysis  unremarkable.   DISPOSITION:  The patient was discharged stable condition and will  follow up with Dr. Allyson Sabal on April 04, 2006.      Abelino Derrick, P.A.      Nanetta Batty, M.D.  Electronically Signed    LKK/MEDQ  D:  04/24/2006  T:  04/25/2006  Job:  [id number]   cc:   Talmadge Coventry, M.D.

## 2010-06-24 NOTE — Discharge Summary (Signed)
[name], [name] NO.:  [id number]   MEDICAL RECORD NO.:  [id number]          PATIENT TYPE:  INP   LOCATION:  3034                         FACILITY:  [facility]   PHYSICIAN:  Nelma Rothman, MD   DATE OF BIRTH:  07-Aug-1945   DATE OF ADMISSION:  04/25/2005  DATE OF DISCHARGE:  04/27/2005                                 DISCHARGE SUMMARY   DISCHARGE DIAGNOSIS:  1.  Transient left sided face and body numbness of unclear etiology.  2.  Chest pain.   SECONDARY DIAGNOSIS:  1.  History of coronary artery disease status postoperative right coronary      artery stent back in November 2005.  2.  Stable middle cerebral artery aneurysm.  3.  Diabetes mellitus type 2.  4.  Hyperlipidemia.  5.  Degenerative joint disease status post recent right knee replacement.  6.  Depression.   PROCEDURES:  1.  CT of the head without contrast on admission demonstrated no evidence of      acute intracranial abnormality, the aneurysm could not be appreciated.  2.  Two view of the chest on March 20 demonstrated mild cardiomegaly without      edema.  There was borderline central airway thickening which may be a      manifestation of bronchitis or reactive airways and thoracic      spondylosis.  3.  MRI/MRA of the brain on March 21 demonstrated an empty sella, there was      no acute infarction demonstrated, there was no abnormal enhancement.      The 2 mm and 4 mm aneurysm of the left middle cerebral artery were      completely stable compared to November 2004.  There was also evidence of      chronic mastoiditis as demonstrated by significant fluid accumulation in      the left mastoid which was noted, also, on the MR of November 2004.  4.  Carotid Dopplers demonstrated no significant stenosis.  5.  2D echocardiogram is still pending at the time of dictation.   LABORATORY DATA:  Serial cardiac enzymes were negative.  CBC and chemistry  panels were within normal limits except for a  very mildly elevated ALP of  46.  Hemoglobin A1C was 6.8.  Fasting lipid profile demonstrated total  cholesterol of 146, triglycerides of 342, HDL 19, LDL 59.  A TSH was 1.713  and a homocystine level was elevated at 29.9.   HISTORY AND PHYSICAL:  Please see dictated admission history and physical  but, briefly, [name] is a very pleasant 65 year old lady with a known  history of coronary artery disease who presented to the [facility] Emergency  Department with the acute onset of chest pain as well as left sided face and  body numbness.  Both of these were transient in nature and were resolving by  the time she reached the emergency department.  She was admitted for further  evaluation and management.   HOSPITAL COURSE:  As stated above, the majority of her symptoms had resolved  by  the time she was admitted to the floor.  A small amount of left cheek  numbness persisted.  She had no recurrence of her chest pain.  She was  initially started on full dose Lovenox for presumed unstable angina and  cardiac enzymes were followed.  These remained completely negative.  She had  no events on telemetry.  This was not felt to be an acute cardiac event,  nevertheless, her cardiologist, Dr. Allyson Sabal of [facility], kindly provided consultation while she was in the hospital.  Her  chest pain was somewhat concerning, but different than her usual angina.  Therefore, he felt that she could be followed as an outpatient and she will  present to his office tomorrow for Cardiolite study and follow up with him  shortly thereafter.  With regards to left sided numbness, the etiology of  this is not entirely clear.  She does have a known left middle cerebral  artery aneurysm, so concern was initially directed toward stroke.  However,  non-contrasted CT was normal.  MRI demonstrated no acute infarction and, in  fact, her aneurysms are stable since November 2004.  With regards to workup  for  other cerebrovascular disease risk factors, carotid Dopplers  demonstrated no significant stenosis.  Fasting lipid profile results are as  above.  LDL is optimal, however, HDL is low and triglycerides are still  somewhat high which are suboptimal, the patient states she has been working  on these with both her primary care physician and Dr. Allyson Sabal for sometime  now.  Her hemoglobin A1C was 6.8 demonstrating fair control of her diabetes.  Homocystine level was checked and was found to be elevated at 29.9, so she  was started on Foltx one tablet daily.  She should continue to work with her  primary care providers on optimization of lipids, but otherwise, was felt to  be stable for discharge home this morning, March 22, with follow up in Dr.  Hazle Coca office tomorrow.   DISCHARGE MEDICATIONS:  There have been no changes in her home medications  with the addition of Foltx 1 tablet p.o. daily.   DISCHARGE INSTRUCTIONS:  She was instructed to present to [facility] tomorrow for a Persantine Cardiolite on March 23 at 10:30 a.m.  She is to have nothing to eat or drink four hours before and no caffeine  after midnight the evening prior.  She will follow up with Dr. Allyson Sabal  approximately two weeks after this.  She will also follow up with her  primary care Denene Alamillo in a couple of weeks.      Nelma Rothman, MD  Electronically Signed     RAR/MEDQ  D:  04/27/2005  T:  04/28/2005  Job:  [id number]   cc:   Talmadge Coventry, M.D.  Fax: [phone]   Nanetta Batty, M.D.  Fax: [phone]

## 2010-06-24 NOTE — Cardiovascular Report (Signed)
NAMEMORGANNE, HAILE NO.:  [id number]   MEDICAL RECORD NO.:  [id number]          PATIENT TYPE:  INP   LOCATION:  2018                         FACILITY:  [facility]   PHYSICIAN:  Nanetta Batty, M.D.   DATE OF BIRTH:  May 27, 1945   DATE OF PROCEDURE:  DATE OF DISCHARGE:                              CARDIAC CATHETERIZATION   Ms. Savitt is a 65 year old moderately overweight white female with history  of CAD status post distal RCA PCI and stenting on January 15, 2004.  The  risk factor profile is positive for treated hypertension, non-insulin-  dependent diabetes, hyperlipidemia and tobacco abuse.  She was admitted Jun 15, 2004, with unstable angina.  She ruled out for myocardial infarction.  There were no acute EKG changes.  She presents now for diagnostic coronary  arteriography to rule out ischemic etiology.   DESCRIPTION OF PROCEDURE:  Patient is brought to the second floor [facility] in the postabsorptive state.  She was premedicated with  p.o. Valium.  Right groin is prepped and shaved in the usual sterile  fashion.  Xylocaine 1% was used for local anesthesia.  A 6 French sheath was  inserted into the right femoral artery using standard Seldinger technique.  The 6 French right and left Judkins diagnostic catheters as well as 6 French  pigtail catheter were used for selective coronary angiography, left  ventriculography, respectively.  Visipaque dye was used for the entirety of  the case.  __________ aorta, left ventricular and pulmonary artery pressures  were recorded.   HEMODYNAMIC DATA:  The aortic systolic pressure was 158, diastolic pressure was 77.  Left ventricular systolic pressure was 159, end diastolic pressure 29.   SELECTIVE CORONARY ANGIOGRAPHY:  1.  Left main normal.  2.  LAD normal.  3.  Ramus intermedius branch normal.  4.  Right coronary artery is dominant with a widely patent stent and 40 to      50% stenosis within the  right coronary artery just proximal to the      stent.   LEFT VENTRICULOGRAPHY:  RAO left ventriculogram was performed using 25 mL of  Visipaque dye 12 mL per second.  The LVEF was estimated greater than 60%  without focal wall motion abnormalities.   IMPRESSION:  Ms. Haeberle has a widely patent right coronary artery stent  without any evidence of significant stenosis which would cause her chest  pain which I suspect now is noncardiac.  Continued medical therapy will be  recommended.   An ACT was measured.  Sheaths were removed.  Pressure was held on the groin  to achieve hemostasis.  The patient left the lab in stable condition.  She  will be discharged home later today and see me back in the office in  approximately two weeks for follow-up.      JB/MEDQ  D:  06/16/2004  T:  06/16/2004  Job:  [id number]   cc:   Second Floor [facility]   [facility]  [address]  [place], [place]  [address]   Talmadge Coventry, M.D.  [address]  [place]  [place] [address]  Fax: [phone]

## 2010-06-24 NOTE — Cardiovascular Report (Signed)
[name], [name] NO.:  [id number]   MEDICAL RECORD NO.:  [id number]          PATIENT TYPE:  OIB   LOCATION:  6523                         FACILITY:  [facility]   PHYSICIAN:  Nanetta Batty, M.D.   DATE OF BIRTH:  Jul 12, 1945   DATE OF PROCEDURE:  01/15/2004  DATE OF DISCHARGE:                              CARDIAC CATHETERIZATION   PROCEDURES PERFORMED:  1.  Catheterization.  2.  Percutaneous coronary intervention with stent.   CARDIOLOGIST:  Nanetta Batty, M.D.   INDICATIONS FOR PROCEDURE:  [name] is a 65 year old Caucasian female,  patient of Dr. Smith Mince, who was referred for evaluation of chest pain.  She has a history of a middle cerebral artery aneurysm, which has been  followed by Dr. Barnett Abu and has been stable.  I discussed the case with  Dr. Danielle Dess prior to intervention and he felt that it was safe to pursue cath  intervention and systemic anticoagulation at low risk.  She also has a  history of hypertension, diabetes and hyperlipidemia.  She is complaining of  a heavy sensation on her chest radiating into her left breast.  This began  approximately a week ago and has been accelerating in frequency and  severity.  She presents now for angiography and potential intervention,   DESCRIPTION OF PROCEDURE:  The patient was brought to the second floor [facility] cardiac cath lab in the post absorptive state.  She was not  premedicated.  She did get some morphine at the end of the case for  headache.  Her right groin was prepped and shaved in the usual sterile  fashion.  One percent Xylocaine was used for local anesthesia.  A 6 French  sheath was inserted into the right femoral artery using standard Seldinger  technique.  Six French right and left Judkins diagnostic catheters as well  as a 6 French pigtail catheter were used for selective coronary angiography,  left ventriculography, distal abdominal aortography and selective right  renal artery  angiography.  Visipaque dye was used for the entirety of the  case.  Retrograde aortic, left ventricular and pullback pressures were  recorded.   HEMODYNAMIC DATA:  1.  Aortic systolic pressure 112, diastolic pressure 60.  2.  Left ventricular systolic pressure 110 and diastolic pressure 6.   SELECTIVE CORONARY ANGIOGRAPHY:  1.  Left Main:  The left main is normal.   1.  LAD:  Th LAD is normal.   1.  Left Circumflex:  The left circumflex was normal.   1.  Right Coronary Artery:  The right coronary artery was a large dominant      vessel  with 40-50% stenosis in the midportion followed in tandem by a      70-80% stenosis at the genu of the vessel.  This gave off a large PDA      and PLA.   VENTRICULOGRAPHIC DATA:  Left Ventriculography:  RAO left ventriculogram was  performed using 25 mL of Visipaque dye at 12 mL per second.  The overall  LVEF was estimated at greater than  60% without focal wall motion  abnormalities.   AORTOGRAPHIC DATA:  Distal abdominal aortogram was performed using 20 mL of  Visipaque dye at 20 mL per second.  The renal arteries appeared widely  patent; however, the right renal artery was poorly visualized.  Infrarenal  abdominal aorta and the iliac bifurcation __________ changes.   Selective right renal artery angiography showed it to be widely patent with  some early bifurcation changes.   IMPRESSION:  [name] has accelerated angina with high grade disease at the  genu of the large dominant right coronary artery.  We will proceed with  percutaneous coronary intervention and stenting.   PROCEDURE DESCRIPTION:  The patient received additionally two baby aspirins  in addition to the one that she took this morning, and 600 mg of p.o.  Plavix.  She received an Angiomax bolus and infusion.  The ACT was  approximately 300.  The existing 6 French sheath was exchanged over a wire  for a 7 [place] sheath.  A 6 French sheath was inserted into the right  femoral  vein.  The RCA ostium had a moderate anterior takeoff and thus was  difficult to access with the Judkins catheter.  Because of this a 7 [place]  hockey stick with side hole guide catheter was used for additional backup.  An 014-190 support guidewire was then passed down the right coronary artery  into the PLA.  The vessel  appeared visually to be at least 5 mm in  diameter.  A 45 x 16 Ultra stent was attempted to pass; however, this did  not traverse the midvessel.  Buddy wire technique was used using an 014-190  BMW with the Ultra over the BMW and this was unsuccessful as well.  Following this a 45 x 16 Liberty stent was then used over the BMW wire with  the support buddy wire in place, and this passed easily to the point of  narrowing.  It should be mentioned that the lesion was predilated with a 30  x 12 Quantum Maverick.  The stent was then deployed at 14 atmospheres (5 mm)  resulting in ST segment elevation.  The balloon was deflated and removed.  The final angiographic result was reduction of an 80% RCA lesion at the genu  to 0% residual.  There was TIMI III flow at the end of the case.  The  patient did have a residual 50% lesion in the midvessel and this was not  intervened upon at this time.   OVERALL IMPRESSION:  Successful right coronary artery percutaneous coronary  intervention and stenting using a 45 Liberty bare metal stent and Angiomax  anticoagulation.   The patient tolerated the procedure well.   The guidewire and catheter were removed. The sheaths were sewn securely in  place.   The patient left the lab in stable condition.  She will be hydrated  overnight and discharged home in the morning if she remains clinically  stable on aspirin and Plavix.  She will see me back in the office in  approximately one to two weeks in follow up.      JB/MEDQ  D:  01/15/2004  T:  01/16/2004  Job:  [id number]  cc:   Second Floor [facility]    [facility]  [address]  [place], [place]   [address]   Talmadge Coventry, M.D.  [address]  [place]  [place] [address]  Fax: [phone]

## 2010-06-24 NOTE — Discharge Summary (Signed)
[name], [name] NO.:  [id number]   MEDICAL RECORD NO.:  [id number]          PATIENT TYPE:  OIB   LOCATION:  3729                         FACILITY:  [facility]   PHYSICIAN:  Nanetta Batty, M.D.   DATE OF BIRTH:  04-Aug-1945   DATE OF ADMISSION:  01/15/2004  DATE OF DISCHARGE:  01/17/2004                                 DISCHARGE SUMMARY   BRIEF HISTORY AND HOSPITAL COURSE:  The patient came in to the hospital for  outpatient elective cardiac catheterization.  She is a 65 year old female  who presented to the office on 01/13/2004 by referral of Dr. Talmadge Coventry with complaints of chest pain.  She was seen by Dr. Nanetta Batty, and it was decided that she should undergo cardiac catheterization.  Thus, this was performed on 01/15/2004.  She was noted to have a 70 to 80%  RCA stenosis and a 40 to 50% stenosis proximal to that.  She underwent  Cypher stenting, 4.5 x 16.   Postprocedure, the following day, her blood pressure was 124/58.  Heart rate  was 78.  Respirations 20.  She did have an episode of chest pain requiring  three nitroglycerin and then Nubain.  Thus it was decided to keep her  overnight another day.  This was done.  We also drew CK/MB and troponin.  They were minimally elevated at 0.05 to 0.06.  It was most likely due to the  intervention that was done.  She had no further chest pain.  This was  thought to be atypical chest pain.  Even though it was the same as prior to  her hospitalization, we do not think this is angina.  Thus it was decided  that she should be discharged home.  Her blood pressure was 130/78.  Heart  rate 73.  Respirations 20.  Room air SAO2 94%.   LABORATORY DATA:  Hemoglobin 12.9, hematocrit 37.1, WBC 5.7, platelets 310.  Sodium 137, potassium 3.8, BUN 17, creatinine 1.2.   DISCHARGE MEDICATIONS:  All the same medications, except we have added  Plavix 75 mg once per day.   She is also on:  1.  Aspirin 81 mg one time  per day.  2.  Wellbutrin XL 150 mg one time per day.  3.  Clonazepam 1 mg three times a day.  4.  Effexor 150 mg two tabs one time per day.  5.  BuSpar 15 mg two times per day.  6.  Altace 10 mg one time per day.  7.  Estrace 1 mg one time per day.  8.  Hydrochlorothiazide one-half one time per day.  9.  Glucose 1,000 mg two times a day.  She can restart that on Monday.  10. Aciphex 20 mg one time per day.  11. Astelin one spray two times per day.  12. Flonase nasal spray.  13. Advair when needed.  14. Atenolol 50 mg two times per day.  15. Tricor 150 mg one time per day.  16. Xanax 1 mg at bedtime.  17. Isordil mononitrate 30 mg one  time per day.  18. Plavix 75 mg as stated before, one time per day.   DISCHARGE DIAGNOSES:  1.  Atherosclerotic cardiovascular disease, status post catheterization with      Cypher stent implantation her right coronary artery.  She had another      lesion proximal to this in the right coronary artery, 40 to 50%.  No      other disease.  Thus, she has single-vessel coronary artery disease.  2.  Normal ejection fraction.  3.  Hypertension.  4.  Hyperlipidemia.  5.  Degenerative joint disease and degenerative disk disease of the lumbar      and thoracic spine.  6.  History of middle cerebral artery aneurysm, stable, followed by Dr.      Danielle Dess.  7.  History of severe anxiety with panic attacks, emotional outbreaks and      depression.  She is on disability.  Her medications are monitored by Dr.      Madaline Guthrie.      BB/MEDQ  D:  01/17/2004  T:  01/17/2004  Job:  [id number]   cc:   Clover Mealy  [address] [address]  [place]  [place] [address]  Fax: [phone]   Talmadge Coventry, M.D.  [address]  [place]  [place] [address]  Fax: [phone]   Stefani Dama, M.D.  [address]  [place]  [place] [address]  Fax: [phone]

## 2010-06-24 NOTE — Discharge Summary (Signed)
NAMESABREENA, VOGAN                   ACCOUNT NO.:  [id number]   MEDICAL RECORD NO.:  [id number]          PATIENT TYPE:  INP   LOCATION:  5004                         FACILITY:  [facility]   PHYSICIAN:  Elana Alm. Thurston Hole, M.D. DATE OF BIRTH:  1945-10-27   DATE OF ADMISSION:  12/14/2004  DATE OF DISCHARGE:  12/19/2004                                 DISCHARGE SUMMARY   ADMITTING DIAGNOSES:  1.  Right knee degenerative joint disease.  2.  Depression/anxiety.  3.  Hypertension.  4.  Diabetes.  5.  Asthma.  6.  High cholesterol.  7.  Coronary artery disease.  8.  Osteoarthritis.  9.  Middle cerebral arterial aneurysm and sleep apnea.   DISCHARGE DIAGNOSES:  1.  End stage degenerative joint disease, right knee.  2.  Depression.  3.  Anxiety.  4.  Hypertension.  5.  Diabetes.  6.  Asthma.  7.  High cholesterol.  8.  Coronary artery disease.  9.  Osteoarthritis.  10. Middle cerebral artery aneurysm.  11. Sleep apnea.  12. Postoperative blood loss anemia.   HISTORY OF PRESENT ILLNESS:  The patient is a 65 year old white female with  history of end stage DJD of her knee.  She has tried conservative care and  anti-inflammatories, cortisone injections without success.  She understands  the risks, benefits and possible complications of a right total knee  replacement and is without question.   PROCEDURES IN HOUSE:  On December 12, 2004, the patient underwent a right  total knee replacement by Dr. Thurston Hole and a right femoral nerve block by  anesthesia.  She was admitted postoperatively for pain control DVT  prophylaxis and physical therapy.  Cardiovascular consult was ordered  postoperatively due to her history of cardiac stent, January 15, 2004.  No  Plavix is needed.   Postoperative day 1, the patient is doing very well.  She has no complaints.  Her hemoglobin is 10.5, her INR is 1.1.  Her dressing is changed.  She was  transferred to telemetry to I-5000.  Consult was ordered for  tobacco  cessation.   On postoperative day 2, the patient was alert, but very drowsy.  She did  answer questions appropriately.  She is afebrile.  Her INR was 1.3.  Hemoglobin was 10.6.  Her Percocet was discontinued.  Continue physical  therapy.   Postoperative day 3, patient continued to improve.  Her hemoglobin was 9.9.  INR was 1.3.  Surgical wound was well approximated.  She continued to  improve with physical therapy.  Had difficulty with safety issues.   Postoperative day 4, patient was alert.  T-max 100.5.  Hemoglobin was 9.2.  She continued to progress with physical therapy.   Postoperative day 5, patient improved significantly and wanted to go home.  Her hemoglobin was 9.4, INR was 1.5.  She was metabolically stable.  Her  surgical wound was well approximated.  She was discharged to home with home  health PT, OT, RN, 3-1 walker with wheels and a tub bench.  Tylenol for pain  due to her postoperative confusion.  Robaxin for muscle spasm.  Coumadin and  Lovenox. She will follow up with Dr. Thurston Hole on November 21.  She will use  her CPM 0-90 degrees 8 hours a day and elevate her right knee on a folded  pillow 30 minutes every morning.  She will not put a pillow under her right  knee.      Kirstin Shepperson, P.A.      Robert A. Thurston Hole, M.D.  Electronically Signed    KS/MEDQ  D:  02/12/2005  T:  02/13/2005  Job:  [id number]

## 2010-06-24 NOTE — Cardiovascular Report (Signed)
NAMEJOYANNE, [name] NO.:  [id number]   MEDICAL RECORD NO.:  [id number]          PATIENT TYPE:  INP   LOCATION:  3735                         FACILITY:  [facility]   PHYSICIAN:  Nanetta Batty, M.D.   DATE OF BIRTH:  November 03, 1945   DATE OF PROCEDURE:  DATE OF DISCHARGE:  03/16/2006                            CARDIAC CATHETERIZATION   [name] is a 65 year old, moderately overweight white female with  history of CAD, status post RCA stenting January 15, 2004.  She was  recathed November 2006 and found to have a patent stent.  Other problems  include type 2 diabetes, hyperlipidemia as well as ongoing tobacco  abuse.  She was admitted on March 15, 2006 with unstable angina.  She  ruled out for myocardial infarction.  She was placed on IV heparin and  nitro.  She presents now for diagnostic coronary angiography to rule out  ischemic etiology.   PROCEDURE DESCRIPTION:  The patient was brought to the __________  [facility] in the postabsorptive state.  She was premedicated  p.o. Valium.  The right groin was prepped and shaved in the usual  sterile fashion.  Lidocaine 1% was used for local anesthesia.  A 6-  French sheath was inserted in the right femoral artery using the  standard Seldinger technique.  A 6-French right and left Judkins  catheters along with sequential pigtail catheters were used selective  coronary angiography and left ventriculography respectively.  Visipaque  dye was used through the entirety of the case.  Left aortic and left  ventricular blood pressures were recorded.   Hemodynamics:  1. Aortic systolic pressure 151, diastolic pressure 75.  2. Left ventricular systolic pressure 154, end diastolic pressure 12.   Coronary angiography:  1. Left main normal.  2. Left anterior descending normal.  3. Left circumflex normal.  4. The right coronary artery was dominant; it had patent stent with a      50% segmental proximal stenosis  just proximal to this, unchanged      from the prior angiogram performed May 2006.   IMPRESSION:  [name] has a patent stent in the right coronary with  noncritical coronary artery disease.  I believe her chest pain was  noncardiac, suspect it is related to stress.  ACT was measured and the  sheath was removed.  Pressure was held on the groin to achieve hemostasis.  Patient left the  lab in stable condition.  She will remain recumbent for 5 hours at which  time she will be discharged home.  She will see me back in the office in  approximately 1-2 weeks in followup.  She left the lab in stable  condition.      Nanetta Batty, M.D.  Electronically Signed     JB/MEDQ  D:  03/16/2006  T:  03/17/2006  Job:  [id number]   cc:   [facility]  [facility]  Talmadge Coventry, M.D.

## 2010-06-24 NOTE — H&P (Signed)
NAMESHENIA, ALAN                   ACCOUNT NO.:  [id number]   MEDICAL RECORD NO.:  [id number]          PATIENT TYPE:  INP   LOCATION:  3034                         FACILITY:  [facility]   PHYSICIAN:  Lonia Blood, M.D.      DATE OF BIRTH:  Nov 28, 1945   DATE OF ADMISSION:  04/25/2005  DATE OF DISCHARGE:                                HISTORY & PHYSICAL   PRIMARY CARE PHYSICIAN:  Dr. Rise Mu Mazzocchi   PRESENTING COMPLAINT:  Chest pressure and left-sided numbness.   HISTORY OF PRESENT ILLNESS:  The patient is a 65 year old with known history  of coronary artery disease, hypertension, and diabetes type 2 who was  apparently doing okay today. While sitting down on her recliner she suddenly  felt chest pressure which was unlike her previous angina. Right after that  the patient felt weak and numb in the left side of her body. That also was  persistent, especially around her left jaw. She subsequently came to the  emergency room. Since arrival at the emergency room her chest pressure has  improved with some nitroglycerin paste. Her numbness has also improved  although she still has numbness left around her jaw. She denied any fall.  She denied any specific weakness. She was still able to use her left side  and all her limbs appropriately. The patient has had decreased mobility  recently since she had a right knee replacement back in November 2006. She  has also been taking her medications adequately and denied any hypoglycemic  episodes recently.   Her past medical history is pretty extensive and includes diabetes type 2,  hypertension, dyslipidemia, degenerative joint disease especially the right  knee which was replaced recently. She has had depression and anxiety. Some  osteoarthritis, coronary artery disease status post RCA stenting back in  November 2005. She had a follow-up in May 2006 with chest pain but was found  to be nonischemic at the time. The patient has middle cerebral  artery  aneurysm. She is being followed by Dr. Danielle Dess. She has some sleep apnea.  Previous history of postoperative blood loss with anemia and some asthma.   ALLERGIES:  The patient has no known drug allergies.   MEDICATIONS:  1.  Clonazepam 1 mg p.r.n.  2.  BuSpar 20 mg daily.  3.  TriCor 145 mg daily.  4.  Plavix 75 mg daily.  5.  Zyrtec 10 mg daily.  6.  Xanax 1 mg p.r.n.  7.  Singulair 10 mg daily.  8.  Effexor 7.5 mg daily.  9.  Hydrochlorothiazide 25 mg daily.  10. Imdur 60 mg daily.  11. Altace 10 mg daily.  12. Aspirin 81 mg daily.  13. AcipHex 20 mg daily.  14. Lipitor 20 mg daily.  15. Glucophage 500 mg twice a day.   SOCIAL HISTORY:  The patient lives in [place]. She is married with three  children and two grandchildren and lives with her husband. Smokes about a  half a pack per day.   FAMILY HISTORY:  Early coronary artery disease in both sides of  her family  with one of her brothers having quadruple bypass surgery.   REVIEW OF SYSTEMS:  A 10-point review of systems is performed but it is  essentially as per HPI.   PHYSICAL EXAMINATION TODAY:  VITAL SIGNS:  Temperature is 99.1, blood  pressure 132/74, her pulse 69, respiratory rate 21, saturations 94% on room  air.  GENERAL:  She is awake, alert, oriented in no acute disease, pleasant woman.  HEENT:  PERRL, EOMI.  NECK:  Supple. No JVD, no lymphadenopathy.  RESPIRATORY:  She has good air entry bilaterally. No wheezes or rales.  CARDIOVASCULAR:  The patient has regular rate and rhythm.  ABDOMEN  Soft, obese, nontender, with positive bowel sounds.  EXTREMITIES:  Show no edema, cyanosis, or clubbing. She has a scar on the  right knee. Otherwise, no other findings.  NEUROLOGIC:  Essentially nonfocal. Sensation is intact and motor function  seems to be intact with power 5/5 both upper and lower extremities  respectively. Her reflexes are 2+ upper and lower extremities respectively.   Initial labs here showed a  sodium 139, potassium 3.9, chloride 104, BUN 16,  glucose 74. Her bicarb is 26. Creatinine 0.9. PT 13.7, INR 1.0. Initial  cardiac enzymes were negative x2. Chest x-ray showed mild cardiomegaly  without edema though some borderline central airway thickening which may be  a manifestation of bronchitis or reactive airway, thoracic spondylosis. EKG  showed normal sinus rhythm with a right axis deviation which is unchanged  from her previous EKG of Jun 16, 2004.   ASSESSMENT:  This is a 65 year old patient who is vasculopathic with history  of coronary artery disease presenting with chest pressure and left-sided  numbness. The differentials are varied. This could all be secondary to some  form of angina. At the same time, the symptoms also suggest possible  transient ischemic attack. With the history of cerebra aneurysm, a CT scan  has been performed which is so far negative for any bleed. The patient,  however, is at risk for both types of stroke - both hemorrhagic and  nonhemorrhagic. As such, the fact that her symptoms have improved, this may  indicate some form of TIA.   PLAN:  1.  TIA. Will admit the patient and work her up for possible TIA. I will      proceed with doing MRI/MRA, especially with her aneurysm which was last      checked in November 2005. I will also check fasting lipid panel,      homocysteine level, 2-D echocardiogram, and carotid Dopplers. She had no      carotid bruit today. I will keep her on aspirin and Plavix. I will also      start her on therapeutic Lovenox at least for now until we figure out      what is going on.  2.  Chest pressure. This could be some form of angina, maybe unstable angina      as well. The chest x-ray so far has not shown any sign of aortic      dissection which is another possibility. This could also be some form of      GI symptom, especially as the patient has history of GERD. I will,     however, continue to check serial cardiac  enzymes. Also, keep her on her      Plavix and aspirin. I will also keep her on her Altace as well as a beta      blocker.  In the meantime, I will also check a fasting lipid panel, TSH,      and BNP. Based on the above, will make a decision as to whether we      should call cardiology to get involved or not.  3.  Depression/anxiety. The patient's symptoms may also have some anxiety      component. I will keep her on her home medications for now.  4.  Dyslipidemia. As indicated, I will check fasting lipid panel and      continue with both Lipitor and TriCor.  5.  Diabetes. I will put the patient on basal insulin and sliding scale      insulin and hold her Glucophage. Will also check hemoglobin A1c and      follow her closely in hospital.      Lonia Blood, M.D.  Electronically Signed     LG/MEDQ  D:  04/26/2005  T:  04/26/2005  Job:  [id number]   cc:   Talmadge Coventry, M.D.  Fax: [phone]   Nanetta Batty, M.D.  Fax: [phone]   Stefani Dama, M.D.  Fax: [phone]

## 2010-06-24 NOTE — Op Note (Signed)
[name], [name]                             ACCOUNT NO.:  [id number]   MEDICAL RECORD NO.:  [id number]                   PATIENT TYPE:  OIB   LOCATION:  5732                                 FACILITY:  [facility]   PHYSICIAN:  Sandria Bales. Ezzard Standing, M.D.               DATE OF BIRTH:  07-08-1945   DATE OF PROCEDURE:  11/21/2002  DATE OF DISCHARGE:                                 OPERATIVE REPORT   PREOPERATIVE DIAGNOSIS:  Cholelithiasis.   POSTOPERATIVE DIAGNOSIS:  Symptomatic cholelithiasis.   PROCEDURE:  Laparoscopic cholecystectomy.   SURGEON:  Sandria Bales. Ezzard Standing, M.D.   FIRST ASSISTANT:  Lebron Conners, M.D.   ANESTHESIA:  General endotracheal.   ESTIMATED BLOOD LOSS:  Minimal.   INDICATIONS:  [name] is a 65 year old white female who has had crampy  upper abdominal pain, ultrasound evidence of gallstones and now comes for an  attempt at laparoscopic cholecystectomy.   I discussed with the patient the indications and potential complications of  the surgery.  Potential complications include but are not limited to  bleeding, infection, bile leak, bowel injury and the possibility of open  surgery.   DESCRIPTION OF PROCEDURE:  The patient was taken to the operating room and  given 1 g of Ancef at the initiation of the procedure.  She had PAS  stockings in place and oral gastric tube placed.  Her abdomen was prepped  with Betadine solution and sterilely draped.   An infraumbilical incision was made with sharp dissection carried down into  the abdominal cavity.  A 10 mm laparoscope was inserted through a 12 mm  Hasson trocar.  The Hasson trocar was secured with a 0 Vicryl suture.  First  I looked at the left and right lobes of the liver were unremarkable except  for some fatty changes.  The anterior wall of the stomach was unremarkable.  The pelvis was unremarkable for any mass or lesion.   Three additional trocars were then placed.  A 10 mm Ethicon trocar in the  subxiphoid  location, a 5 mm right mid subcostal and a 5 mm right lateral  subcostal trocar.  The gallbladder was identified, grasped, rotated  cephalad.  The gallbladder was noted to be partially intrahepatic and also  between two kind of fatty lobes of the liver.   Dissection was carried out first identifying the cystic duct and then the  cystic artery and the triangle of Calot.  A clip was placed on the  gallbladder side of the cystic duct and then an attempt at cholangiogram was  obtained.  A cut off taut catheter was inserted through a 16 gauge Gelco  catheter.  I tried to insert the taut catheter into the side of the cystic  duct, but the cystic duct was no bigger than the taut catheter, and I was  unsuccessful in trying to cannulate the duct.   I  really thought I clearly identified the duct.  I was right up against the  gallbladder when I divided this.  I placed three clips on the side of the  cystic duct and then divided the gallbladder.  I then identified the cystic  artery, placed clips on the stay side of the cystic artery and divided it.  I did not do an intraoperative cholangiogram. .   And secured with an Endo-Clip.   The gallbladder was then sharply and bluntly dissected from the gallbladder  bed using primarily the hook and Bovie coagulation.  Prior to complete division of the gallbladder from the gallbladder bed, I  revisualized the gallbladder bed, irrigated with saline, controlled bleeding  with Bovie electrocautery.  There was no bleeding, no bowel leak from the  gallbladder bed or the triangle of Calot.  I then irrigated the gallbladder  and removed it, placed it in and EndoCatch bag and delivered it through the  umbilicus.   I then closed the umbilical wound with a 0 Vicryl suture, closed the skin at  each site with a 5-0 Vicryl suture, painted the wound with tincture of  Benzoin and steri-stripped.  The patient tolerated the procedure well.  There was minimal blood loss.   Sponge and needle count were correct at the  end of the case.  She was transferred to the recovery room in good  condition.                                               Sandria Bales. Ezzard Standing, M.D.    DHN/MEDQ  D:  11/21/2002  T:  11/21/2002  Job:  [id number]   cc:   Talmadge Coventry, M.D.  [address]  [place]  [place] [address]  Fax: [phone]

## 2010-06-24 NOTE — Discharge Summary (Signed)
NAMEANALISSA, [name] NO.:  [id number]   MEDICAL RECORD NO.:  [id number]          PATIENT TYPE:  INP   LOCATION:  2018                         FACILITY:  [facility]   PHYSICIAN:  Nanetta Batty, M.D.   DATE OF BIRTH:  April 21, 1945   DATE OF ADMISSION:  06/15/2004  DATE OF DISCHARGE:                                 DISCHARGE SUMMARY   Ms. Kovatch is a 65 year old female with a history of CAD, status post RCA  monorail stent to 70-80% RCA stenosis, January 15, 2004.  She had a followup  Cardiolite, February 04, 2004, no ischemia.  She had an increased heart rate  and beta-blockers were increased, March 2006.  Today she woke up with  substernal chest pain like a band.  Symptoms are worse on treadmill at  cardiac rehab, better with nitroglycerin.  She was brought to the hospital,  put on IV heparin, IV nitroglycerin, and she wanted to have a cardiac  catheterization.  This was performed Jun 16, 2004.  Her LV was normal.  She  had a 40-50% proximal stenosis of her RCA stent, but her RCA stent was  patent.  It was decided to DC her home, however, because of the shortness of  breath symptoms a V/Q scan had been done that showed some intermediate  abnormality, thus she went on to have a CT scan.  The CT scan was negative  for pulmonary embolus.  She was discharged home on Jun 17, 2004.   LABS:  Sodium 139, potassium 4.4, BUN 23, creatinine 1.5.  SGOT 28, SGPT 53.  CK-MB 2.6.  Platelets 307.  Hemoglobin 13.8, hematocrit 40.  CK-MB was  negative.  Troponin, the first one was 0.07, after that they were negative x  2.  Urine was negative for any pathology.   DISCHARGE MEDICATIONS:  1.  Wellbutrin 150 mg every day.  2.  Klonopin 1 mg three times a day.  3.  Flexeril __________  one every day.  4.  BuSpar 15-20 mg home dose, one twice a day.  5.  Xanax 1 mg at bedtime.  6.  Altace 10 mg every day.  7.  Atenolol 1 and 1/2 tablets every 12 hours.  8.  HCTZ 25 mg a half every day.  9.  Glucophage 500 mg one b.i.d.  She knows to hold her Glucophage for two      days after her cath.  10. TriCor 145 mg every day.  11. Aciphex 20 mg one twice a day.  12. Aspirin 81 mg every day.  13. Plavix 75 mg every day.  14. Lipitor 40 mg one tab per day.  15. Imdur 30 mg one tab per day.   She should do light activity for 72 hours and resume her normal activities.   She will follow up with Dr. Allyson Sabal on Jun 28, 2004 at 11 a.m.   DISCHARGE DIAGNOSES:  1.  Chest pain, not ischemic related, status post cardiac catheterization,      Jun 16, 2004, without any in-stent restenosis.  She does have a proximal  40-50% blockage to her right coronary artery stent.  No other disease      noted.  2.  Normal left ventricular function.  3.  Negative CT scan for pulmonary embolus.  4.  Psychiatric problems seen by psychiatry on multiple medications.  5.  Hypertension.  6.  Non-insulin-dependent diabetes mellitus.  7.  History of right coronary artery stent, January 15, 2004, negative      Cardiolite February 04, 2004.  8.  Tobacco use.  9.  Hyperlipidemia.  10. Obesity.      BB/MEDQ  D:  06/17/2004  T:  06/17/2004  Job:  [id number]   cc:   Talmadge Coventry, M.D.  [address]  [place]  [place] [address]  Fax: [phone]

## 2010-06-24 NOTE — Discharge Summary (Signed)
NAMELYNEL, FORESTER NO.:  [id number]   MEDICAL RECORD NO.:  [id number]          PATIENT TYPE:  INP   LOCATION:  4738                         FACILITY:  [facility]   PHYSICIAN:  Darlin Priestly, MD  DATE OF BIRTH:  [birth year]   DATE OF ADMISSION:  10/25/2006  DATE OF DISCHARGE:  10/29/2006                               DISCHARGE SUMMARY   Ms. [name] is a 65 year old white married female patient, who came to  the emergency room with shortness of breath and pressure above her left  sternum.  She had a history of coronary artery disease.  She had an RCA  stent in 2005.  She had a cath in 2006 with a patent stent.  Her last  cath was March 16, 2006.  She had a patent stent.  Her left main LAD  and circumflex were normal.  She had no changes from the cath in 2006.  She stated that for about 2 weeks she was having shortness of breath,  dyspnea on exertional with chest pain.  She apparently, 3 weeks ago, had  a urological procedure.  She had call Dr. Imelda Pillow office about  continued discharge, a mild fever and occasional hematuria.  She also  complained to him about chest pain, shortness of breath and increased  fatigue.  She called our office and she was told to come to the  emergency room.  She described her pain as similar to that of prior to  stenting.  It was decided that she should be admitted.  She was seen by  Dr. Nicki Guadalajara.  She had recurrent chest pain, pressure, shortness of  breath in the hospital.  Thus, it was decided that she should undergo  cardiac catheterization.  This was performed on October 29, 2006 by  Dr. Lenise Herald.  She had two 30% blockages in her LAD.  Her RCA, she  had a 50% blockage.  Her stent was patent and she had 50% in her PLA.  Her EF was 45%.  Her medications were not changed and she was told she  could go home later in the day after her bedrest was up.   LABORATORY DATA:  Hemoglobin 12, hematocrit 35.1, WBC 5.7,  platelets  279, D-dimer 0.22.  Sodium 139, potassium 3.6, chloride 97, CO2 30,  glucose 84, BUN  22, creatinine 1.05.  Total protein 6.6, albumin 4, ALT  48, ALT 41, magnesium 0.6.  CK-MBs were negative x4.  Hemoglobin A1c  6.3, total cholesterol 1.1, triglycerides 396, HDL was 18, and LDL  44.  There is no chest x-ray in the chart at the time of this dictation.   MEDICATIONS:  The same as prior to hospitalization which include:  1. HCTZ 25 mg 1/2 tablet every day.  2. Lipitor 40 mg a day.  3. Nebulizer with DuoNebs p.r.n.  4. Singulair 10 mg at bedtime.  5. Tricor 145 mg every day.  6. Zyrtec 10 mg every day.  7. Folic acid daily.  8. Lovaza 1 g daily.  9. Nitroglycerin p.r.n.  10.Plavix 75  mg every day.  11.Isosorbide mononitrate 60 mg every day.  12.Alprazolam 1 mg p.r.n.  13.BuSpar 15 mg 2 tablets b.i.d.  14.Clonazepam 1 mg b.i.d.  15.Effexor 37.5 three tabs daily.  16.Wellbutrin 150 mg daily.  17.Aciphex 20 mg every day.  18.Advair 250 mcg 1 puff.  19.Ramipril 10 mg every day.  20.Astelin spray.  21.Atenolol 50 mg 2 tablets b.i.d.  22.Flonase 2 sprays at bedtime.  23.Janumet 50/1000 b.i.d.   DISCHARGE DIAGNOSES:  1. Chest pain, musculoskeletal versus gastroesophageal reflux disease,      not ischemic with cardiac catheterization showing nonobstructive      disease.  2. Prior history of atherosclerotic cardiovascular disease with      history of right coronary artery stent in 01/2004.  3. Ejection fraction of 45%.  4. Non-insulin-dependent diabetes mellitus.  Hemoglobin A1c this      admission 6.3.  5. Dyslipidemia with very low HDL, high triglycerides. LDL within goal      at 44, however,  her none HDL is 123.  6. Continued tobacco use.  7. Depression and psychiatric disorder, on multiple medications,      followed by Dr. Carlota Raspberry.  8. __________  sleep apnea.  He uses continuous positive airway      pressure.  9. Gastroesophageal reflux disease.   10.Hypertension.  11.History of asthma.  12.History of mid-cerebral aneurysm.      [name], N.P.      Darlin Priestly, MD  Electronically Signed    BB/MEDQ  D:  12/10/2006  T:  12/11/2006  Job:  [id number]   cc:   Verline Lema

## 2010-06-24 NOTE — H&P (Signed)
NAMELUA, FENG NO.:  [id number]   MEDICAL RECORD NO.:  [id number]          PATIENT TYPE:  EMS   LOCATION:  MAJO                         FACILITY:  [facility]   PHYSICIAN:  Nanetta Batty, M.D.   DATE OF BIRTH:  1945-08-26   DATE OF ADMISSION:  06/15/2004  DATE OF DISCHARGE:                                HISTORY & PHYSICAL   CHIEF COMPLAINT:  Chest pain, like a band around my chest.   __________  Talmadge Coventry, M.D. and Dr. Allyson Sabal.   HISTORY OF PRESENT ILLNESS:  Ms. Hoch is a 65 year old female, followed by  Dr. Smith Mince and Dr. Allyson Sabal, with a history of coronary disease.  She  underwent an intervention to the RCA, January 15, 2004, with a 4.5 Monorail  stent to a 70-80% lesion.  She had no other significant coronary disease.  She had a followup Cardiolite study, February 04, 2004, that was negative  for ischemia.  Later in March, she was seen in the office and her beta-  blocker was increased because of some baseline tachycardia.  Today at rehab,  she had substernal chest pain that worsened when she was on the treadmill.  It was relieved with rest and nitroglycerin.  It was not associated with any  nausea, vomiting, or diaphoresis.  She is admitted now for further  evaluation.   PAST MEDICAL HISTORY:  1.  Anxiety and depression.  She sees a psychiatrist for this.  2.  Hypertension.  3.  Non-insulin-dependent diabetes.  4.  Dyslipidemia.  5.  Has had previous gallbladder surgery.  6.  Hysterectomy.  7.  Cerebral artery aneurysm that is followed by Dr. Danielle Dess.  Dr. Danielle Dess      cleared her for her intervention in December.   CURRENT MEDICATIONS:  1.  Effexor 112.5 mg a day.  2.  BuSpar has recently been increased to 20 mg b.i.d.  3.  Wellbutrin XL 150 mg a day.  4.  Clonazepam 1 mg t.i.d.  5.  Altace 10 mg a day.  6.  Atenolol 100 mg b.i.d.  7.  Glucophage 500 mg b.i.d.  8.  Aciphex 20 mg a day.  9.  TriCor 145 mg a day.  10. Lipitor, she is  unclear of the dose.  11. Plavix 75 mg a day.  12. Aspirin 81 mg a day.  13. Imdur 30 mg at h.s.  14. Xanax 1 mg h.s.  15. Singulair.  16. Zyrtec.   She is apparently allergic to:  1.  SHRIMP.  2.  MACRODANTIN.   SOCIAL HISTORY:  She is less than a half pack a day smoker.  She is married,  three children, two grandchildren.  She lives with her husband.   FAMILY HISTORY:  Remarkable for coronary disease, her brother had bypass  surgery.   REVIEW OF SYSTEMS:  Remarkable for fatigue.  She denies any unusual dyspnea,  or fever chills, or hemoptysis.  As noted above, her psychiatric medications  have recently been adjusted.  I believe her Effexor was cut back and her  BuSpar increased because  of some irritability.   PHYSICAL EXAMINATION:  VITAL SIGNS:  Blood pressure 110/75, pulse 66, O2 sat  is 92%, respirations 12.  GENERAL:  She is a well developed overweight female in no acute distress.  HEENT:  Normocephalic.  She does wear glasses.  NECK:  Without JVD, without bruits.  CHEST:  Clear to auscultation percussion.  CARDIAC:  Reveals a regular rate and rhythm without murmur, rub or gallop.  Normal S1 S2.  ABDOMEN:  Obese, nontender.  No hepatosplenomegaly.  EXTREMITIES:  Without edema.  Distal pulses are 2+/4.  NEURO:  Grossly intact.  SKIN:  Warm and dry.   EKG shows a sinus rhythm without acute changes.  Lab is pending.   IMPRESSION:  1.  Unstable angina.  2.  Coronary disease, right coronary artery stent, January 15, 2004, with      negative Cardiolite study in followup February 04, 2004.  3.  Hypertension, treated.  4.  Non-insulin-dependent diabetes.  5.  History of smoking.  6.  Dyslipidemia.  7.  Obesity.  8.  Anxiety and depression.   PLAN:  1.  The patient was seen by Dr. Allyson Sabal and myself today in the office.  2.  She will be admitted to telemetry and setup for a diagnostic      catheterization tomorrow.  3.  Dr. Allyson Sabal would also like to get a spiral CT of  her chest today.    LKK/MEDQ  D:  06/15/2004  T:  06/15/2004  Job:  [id number]

## 2010-11-08 LAB — CREATININE, SERUM
Creatinine, Ser: 1.19
GFR calc non Af Amer: 46 — ABNORMAL LOW

## 2010-11-08 LAB — BUN: BUN: 9

## 2010-11-17 LAB — D-DIMER, QUANTITATIVE: D-Dimer, Quant: 0.22

## 2010-11-17 LAB — I-STAT 8, (EC8 V) (CONVERTED LAB)
BUN: 18
Bicarbonate: 28.6 — ABNORMAL HIGH
Chloride: 103
Glucose, Bld: 83
HCT: 46
Hemoglobin: 15.6 — ABNORMAL HIGH
Operator id: 282201
pCO2, Ven: 44.6 — ABNORMAL LOW

## 2010-11-17 LAB — CARDIAC PANEL(CRET KIN+CKTOT+MB+TROPI)
CK, MB: 1.4
CK, MB: 1.8
Relative Index: 1.8
Total CK: 101
Troponin I: 0.01
Troponin I: 0.02

## 2010-11-17 LAB — COMPREHENSIVE METABOLIC PANEL
ALT: 48 — ABNORMAL HIGH
AST: 27
Alkaline Phosphatase: 41
CO2: 29
Chloride: 102
GFR calc Af Amer: 57 — ABNORMAL LOW
GFR calc non Af Amer: 47 — ABNORMAL LOW
Glucose, Bld: 87
Sodium: 139
Total Bilirubin: 0.6

## 2010-11-17 LAB — BASIC METABOLIC PANEL
GFR calc Af Amer: >60
GFR calc non Af Amer: 43 — ABNORMAL LOW
GFR calc non Af Amer: 53 — ABNORMAL LOW
Glucose, Bld: 75
Potassium: 3.6
Potassium: 4.1
Sodium: 137
Sodium: 139

## 2010-11-17 LAB — URINALYSIS, ROUTINE W REFLEX MICROSCOPIC
Glucose, UA: NEGATIVE
Protein, ur: NEGATIVE
Specific Gravity, Urine: 1.019
pH: 6.5

## 2010-11-17 LAB — POCT CARDIAC MARKERS
CKMB, poc: 1
Myoglobin, poc: 91.2
Operator id: 282201

## 2010-11-17 LAB — URINE MICROSCOPIC-ADD ON

## 2010-11-17 LAB — APTT: aPTT: 32

## 2010-11-17 LAB — CBC
Platelets: 279
RDW: 13.9

## 2010-11-17 LAB — LIPID PANEL
HDL: 18 — ABNORMAL LOW
Triglycerides: 396 — ABNORMAL HIGH

## 2010-11-17 LAB — PROTIME-INR: Prothrombin Time: 13.4

## 2010-11-17 LAB — MAGNESIUM: Magnesium: 1.6

## 2010-11-17 LAB — GLUCOSE, RANDOM: Glucose, Bld: 99

## 2010-11-18 LAB — URINALYSIS, ROUTINE W REFLEX MICROSCOPIC
Bilirubin Urine: NEGATIVE
Ketones, ur: NEGATIVE
Nitrite: NEGATIVE
Protein, ur: NEGATIVE

## 2010-11-18 LAB — BASIC METABOLIC PANEL
BUN: 17
Chloride: 108
Creatinine, Ser: 1.16
GFR calc non Af Amer: 47 — ABNORMAL LOW
Glucose, Bld: 94

## 2010-11-18 LAB — PROTIME-INR
INR: 1.1
Prothrombin Time: 14.9

## 2010-11-18 LAB — POCT I-STAT 4, (NA,K, GLUC, HGB,HCT)
HCT: 37
Hemoglobin: 12.6
Potassium: 4.2

## 2010-11-18 LAB — HEMOGLOBIN AND HEMATOCRIT, BLOOD: Hemoglobin: 13.3

## 2012-04-05 ENCOUNTER — Other Ambulatory Visit: Payer: Self-pay | Admitting: Neurological Surgery

## 2012-04-05 DIAGNOSIS — M4712 Other spondylosis with myelopathy, cervical region: Secondary | ICD-10-CM

## 2012-04-15 ENCOUNTER — Ambulatory Visit
Admission: RE | Admit: 2012-04-15 | Discharge: 2012-04-15 | Disposition: A | Discharge status: Home / Self Care | Payer: Medicare Other | Source: Ambulatory Visit | Attending: Neurological Surgery | Admitting: Neurological Surgery

## 2012-04-15 DIAGNOSIS — M4712 Other spondylosis with myelopathy, cervical region: Secondary | ICD-10-CM

## 2012-04-25 ENCOUNTER — Other Ambulatory Visit: Payer: Self-pay | Admitting: Neurological Surgery

## 2012-05-07 ENCOUNTER — Encounter (HOSPITAL_COMMUNITY): Payer: Self-pay | Admitting: Pharmacy Technician

## 2012-05-13 ENCOUNTER — Encounter (HOSPITAL_COMMUNITY): Payer: Self-pay

## 2012-05-13 ENCOUNTER — Encounter (HOSPITAL_COMMUNITY)
Admission: RE | Admit: 2012-05-13 | Discharge: 2012-05-13 | Disposition: A | Discharge status: Home / Self Care | Payer: Medicare Other | Source: Ambulatory Visit | Attending: Neurological Surgery | Admitting: Neurological Surgery

## 2012-05-13 HISTORY — DX: Type 2 diabetes mellitus without complications: E11.9

## 2012-05-13 HISTORY — DX: Cerebral infarction, unspecified: I63.9

## 2012-05-13 HISTORY — DX: Chronic kidney disease, unspecified: N18.9

## 2012-05-13 HISTORY — DX: Major depressive disorder, single episode, unspecified: F32.9

## 2012-05-13 HISTORY — DX: Myoneural disorder, unspecified: G70.9

## 2012-05-13 HISTORY — DX: Depression, unspecified: F32.A

## 2012-05-13 HISTORY — DX: Shortness of breath: R06.02

## 2012-05-13 HISTORY — DX: Unspecified osteoarthritis, unspecified site: M19.90

## 2012-05-13 HISTORY — DX: Atherosclerotic heart disease of native coronary artery without angina pectoris: I25.10

## 2012-05-13 HISTORY — DX: Anxiety disorder, unspecified: F41.9

## 2012-05-13 HISTORY — DX: Sleep apnea, unspecified: G47.30

## 2012-05-13 HISTORY — DX: Family history of other specified conditions: Z84.89

## 2012-05-13 HISTORY — DX: Essential (primary) hypertension: I10

## 2012-05-13 LAB — CBC
Hemoglobin: 14.5 g/dL (ref 12.0–15.0)
MCHC: 35.9 g/dL (ref 30.0–36.0)
RBC: 4.89 MIL/uL (ref 3.87–5.11)

## 2012-05-13 LAB — SURGICAL PCR SCREEN
MRSA, PCR: NEGATIVE
Staphylococcus aureus: POSITIVE — AB

## 2012-05-13 LAB — BASIC METABOLIC PANEL
GFR calc Af Amer: 37 mL/min — ABNORMAL LOW (ref >90)
GFR calc non Af Amer: 32 mL/min — ABNORMAL LOW (ref >90)
Potassium: 4.8 mEq/L (ref 3.5–5.1)
Sodium: 135 mEq/L (ref 135–145)

## 2012-05-13 NOTE — Progress Notes (Signed)
Call to [facility], requesting last ov note & ekg.  Pt. Will have stress test later this week, will call for the result on 05/16/2012.

## 2012-05-13 NOTE — Pre-Procedure Instructions (Signed)
[name]  05/13/2012   Your procedure is scheduled on: Tuesday, April 15th.  Report to [facility] at 10:00 AM.  Call this number if you have problems the morning of surgery: [phone]   Remember:   Do not eat food or drink liquids after midnight.   ON MONDAY    Take these medicines the morning of surgery with A SIP OF WATER: buPROPion (WELLBUTRIN XL) 300  clonazePAM (KLONOPIN) 2 MG  venlafaxine XR (EFFEXOR-XR) 150 MG busPIRone (BUSPAR) 10    May use: azelastine (ASTELIN) 137 MCG/SPRAY  naphazoline (NAPHCON) 0.1 % ophthalmic solution    Take if needed: nitroGLYCERIN (NITROSTAT) 0.4 MG SL    Stop taking Aspirin, Coumadin, Plavix, Effient and Herbal medications.  Do not take any NSAIDs ie: Ibuprofen,  Advil,Naproxen or any medication containing Aspirin.    Do not wear jewelry, make-up or nail polish.  Do not wear lotions, powders, or perfumes. You may wear deodorant.  Do not shave 48 hours prior to surgery.  Do not bring valuables to the hospital.  Contacts, dentures or bridgework may not be worn into surgery.  Leave suitcase in the car. After surgery it may be brought to your room.  For patients admitted to the hospital, checkout time is 11:00 AM the day of discharge.   Patients discharged the day of surgery will not be allowed to drive home.  Name and phone number of your driver: -sister   Special Instructions: Shower using CHG 2 nights before surgery and the night before surgery.  If you shower the day of surgery use CHG.  Use special wash - you have one bottle of CHG for all showers.  You should use approximately 1/3 of the bottle for each shower.   Please read over the following fact sheets that you were given: Pain Booklet, Coughing and Deep Breathing and Surgical Site Infection Prevention

## 2012-05-15 ENCOUNTER — Other Ambulatory Visit (HOSPITAL_COMMUNITY): Payer: Self-pay | Admitting: Cardiovascular Disease

## 2012-05-15 DIAGNOSIS — Z01818 Encounter for other preprocedural examination: Secondary | ICD-10-CM

## 2012-05-16 ENCOUNTER — Ambulatory Visit (HOSPITAL_COMMUNITY)
Admission: RE | Admit: 2012-05-16 | Discharge: 2012-05-16 | Disposition: A | Discharge status: Home / Self Care | Payer: Medicare Other | Source: Ambulatory Visit | Attending: Cardiovascular Disease | Admitting: Cardiovascular Disease

## 2012-05-16 DIAGNOSIS — E119 Type 2 diabetes mellitus without complications: Secondary | ICD-10-CM | POA: Insufficient documentation

## 2012-05-16 DIAGNOSIS — Z9861 Coronary angioplasty status: Secondary | ICD-10-CM | POA: Insufficient documentation

## 2012-05-16 DIAGNOSIS — R5383 Other fatigue: Secondary | ICD-10-CM | POA: Insufficient documentation

## 2012-05-16 DIAGNOSIS — I129 Hypertensive chronic kidney disease with stage 1 through stage 4 chronic kidney disease, or unspecified chronic kidney disease: Secondary | ICD-10-CM | POA: Insufficient documentation

## 2012-05-16 DIAGNOSIS — Z8249 Family history of ischemic heart disease and other diseases of the circulatory system: Secondary | ICD-10-CM | POA: Insufficient documentation

## 2012-05-16 DIAGNOSIS — Z01818 Encounter for other preprocedural examination: Secondary | ICD-10-CM

## 2012-05-16 DIAGNOSIS — N189 Chronic kidney disease, unspecified: Secondary | ICD-10-CM | POA: Insufficient documentation

## 2012-05-16 DIAGNOSIS — R5381 Other malaise: Secondary | ICD-10-CM | POA: Insufficient documentation

## 2012-05-16 DIAGNOSIS — I2510 Atherosclerotic heart disease of native coronary artery without angina pectoris: Secondary | ICD-10-CM | POA: Insufficient documentation

## 2012-05-16 MED ORDER — TECHNETIUM TC 99M SESTAMIBI GENERIC - CARDIOLITE
30.0000 | Freq: Once | INTRAVENOUS | Status: AC | PRN
  Administered 2012-05-16: 30 via INTRAVENOUS

## 2012-05-16 MED ORDER — AMINOPHYLLINE 25 MG/ML IV SOLN
150.0000 mg | Freq: Once | INTRAVENOUS | Status: AC
  Administered 2012-05-16: 150 mg via INTRAVENOUS

## 2012-05-16 MED ORDER — REGADENOSON 0.4 MG/5ML IV SOLN
0.4000 mg | Freq: Once | INTRAVENOUS | Status: AC
  Administered 2012-05-16: 0.4 mg via INTRAVENOUS

## 2012-05-16 MED ORDER — TECHNETIUM TC 99M SESTAMIBI GENERIC - CARDIOLITE
10.0000 | Freq: Once | INTRAVENOUS | Status: AC | PRN
  Administered 2012-05-16: 10 via INTRAVENOUS

## 2012-05-16 NOTE — Procedures (Addendum)
Janet [name] [facility] [address] [place] [address] [place] [address] [phone]  Cardiology Nuclear Med Study  Janet [name] is [name] 67 y.o. female     MRN : [id number]     DOB: 11/25/1945  Procedure Date: 05/16/2012  Nuclear Med Background Indication for Stress Test:  Surgical Clearance History:  cad;stent/ptca--01/15/2004 Cardiac Risk Factors: Family History - CAD, History of Smoking, Hypertension, NIDDM and Overweight  Symptoms:  Fatigue   Nuclear Pre-Procedure Caffeine/Decaff Intake:  7:00pm NPO After: 5:00am   IV Site: R Forearm  IV 0.9% NS with Angio Cath:  22g  Chest Size (in):  N/[name] IV Started by: Janet Pomfret, RN  Height: 5\' 1"  (1.549 m)  Cup Size: C  BMI:  Body mass index is 31.19 kg/(m^2). Weight:  165 lb (74.844 kg)   Tech Comments:  N/[name]    Nuclear Med Study 1 or 2 day study: 1 day  Stress Test Type:  Lexiscan  Order Authorizing Provider:  Nanetta Batty, MD   Resting Radionuclide: Technetium 75m Sestamibi  Resting Radionuclide Dose: 11.0 mCi   Stress Radionuclide:  Technetium 23m Sestamibi  Stress Radionuclide Dose: 31.0 mCi           Stress Protocol Rest HR: 61 Stress HR: 73  Rest BP: 130/78 Stress BP: 135/82  Exercise Time (min): n/[name] METS: n/[name]   Predicted Max HR: 152 bpm % Max HR: 48.03 bpm Rate Pressure Product: 01027  Dose of Adenosine (mg):  n/[name] Dose of Lexiscan: 0.4 mg  Dose of Atropine (mg): n/[name] Dose of Dobutamine: n/[name] mcg/kg/min (at max HR)  Stress Test Technologist: Janet [name], CCT Nuclear Technologist: Janet [name], CNMT   Rest Procedure:  Myocardial perfusion imaging was performed at rest 45 minutes following the intravenous administration of Technetium 79m Sestamibi. Stress Procedure:  The patient received IV Lexiscan 0.4 mg over 15-seconds.  Technetium 79m Sestamibi injected at 30-seconds.  The patient experienced SOB, Nausea and dry heaves; 75 mg of IV Aminophylline was administered with resolution of  symptoms.  There were no significant changes with Lexiscan.  Quantitative spect images were obtained after [name] 45 minute delay.  Transient Ischemic Dilatation (Normal <1.22):  1.0 Lung/Heart Ratio (Normal <0.45):  0.34 QGS EDV:  56  ml QGS ESV:  17 ml LV Ejection Fraction: 70%  Signed by     Rest ECG: NSR - Normal EKG  Stress ECG: No significant change from baseline ECG  QPS Raw Data Images:  Normal; no motion artifact; normal heart/lung ratio. Stress Images:  Normal homogeneous uptake in all areas of the myocardium. Rest Images:  Normal homogeneous uptake in all areas of the myocardium. Subtraction (SDS):  Normal  Impression Exercise Capacity:  Lexiscan with no exercise. BP Response:  Normal blood pressure response. Clinical Symptoms:  No significant symptoms noted. ECG Impression:  No significant ST segment change suggestive of ischemia. Comparison with Prior Nuclear Study: No significant change from previous study  Overall Impression:  Normal stress nuclear study. Low risk stress nuclear study.  LV Wall Motion:  NL LV Function, EF 70%; NL Wall Motion   Janet [name] A, MD  05/16/2012 2:16 PM

## 2012-05-20 MED ORDER — CEFAZOLIN SODIUM-DEXTROSE 2-3 GM-% IV SOLR
2.0000 g | INTRAVENOUS | Status: AC
  Administered 2012-05-21: 2 g via INTRAVENOUS
  Filled 2012-05-20: qty 50

## 2012-05-20 NOTE — Progress Notes (Signed)
Anesthesia Note:  Patient for C5-6 ACDF tomorrow by Dr. Danielle Dess.  I was not given chart to review, but received a cardiac clearance note completed by Dr. Herbie Baltimore faxed to me this afternoon from [facility]. Patient has a history of CAD s/p remote history of RCA stent and had a normal stress test on 05/16/12, EF 70%.  Preoperative labs noted.  Cr 1.61.  No recent comparison labs were requested until just now, but history indicates he is followed by Dr. Hyman Hopes at [facility] for CKD.  Cardiac clearance note placed on chart.  Velna Ochs [facility] Stay [facility]/Anesthesiology Phone [phone] 05/20/2012 4:21 PM

## 2012-05-21 ENCOUNTER — Inpatient Hospital Stay (HOSPITAL_COMMUNITY)
Admission: RE | Admit: 2012-05-21 | Discharge: 2012-05-22 | DRG: 473 | Disposition: A | Discharge status: Home / Self Care | Payer: Medicare Other | Source: Ambulatory Visit | Attending: Neurological Surgery | Admitting: Neurological Surgery

## 2012-05-21 ENCOUNTER — Inpatient Hospital Stay (HOSPITAL_COMMUNITY): Payer: Medicare Other | Admitting: Anesthesiology

## 2012-05-21 ENCOUNTER — Encounter (HOSPITAL_COMMUNITY): Payer: Self-pay | Admitting: *Deleted

## 2012-05-21 ENCOUNTER — Encounter (HOSPITAL_COMMUNITY)
Admission: RE | Disposition: A | Discharge status: Home / Self Care | Payer: Self-pay | Source: Ambulatory Visit | Attending: Neurological Surgery

## 2012-05-21 ENCOUNTER — Encounter (HOSPITAL_COMMUNITY): Payer: Self-pay | Admitting: Vascular Surgery

## 2012-05-21 ENCOUNTER — Inpatient Hospital Stay (HOSPITAL_COMMUNITY): Payer: Medicare Other

## 2012-05-21 DIAGNOSIS — Z8673 Personal history of transient ischemic attack (TIA), and cerebral infarction without residual deficits: Secondary | ICD-10-CM

## 2012-05-21 DIAGNOSIS — Z23 Encounter for immunization: Secondary | ICD-10-CM

## 2012-05-21 DIAGNOSIS — E119 Type 2 diabetes mellitus without complications: Secondary | ICD-10-CM | POA: Diagnosis present

## 2012-05-21 DIAGNOSIS — I2510 Atherosclerotic heart disease of native coronary artery without angina pectoris: Secondary | ICD-10-CM | POA: Diagnosis present

## 2012-05-21 DIAGNOSIS — M47812 Spondylosis without myelopathy or radiculopathy, cervical region: Secondary | ICD-10-CM | POA: Diagnosis present

## 2012-05-21 DIAGNOSIS — F329 Major depressive disorder, single episode, unspecified: Secondary | ICD-10-CM | POA: Diagnosis present

## 2012-05-21 DIAGNOSIS — G4730 Sleep apnea, unspecified: Secondary | ICD-10-CM | POA: Diagnosis present

## 2012-05-21 DIAGNOSIS — G589 Mononeuropathy, unspecified: Secondary | ICD-10-CM | POA: Diagnosis present

## 2012-05-21 DIAGNOSIS — I10 Essential (primary) hypertension: Secondary | ICD-10-CM | POA: Diagnosis present

## 2012-05-21 DIAGNOSIS — F3289 Other specified depressive episodes: Secondary | ICD-10-CM | POA: Diagnosis present

## 2012-05-21 DIAGNOSIS — M129 Arthropathy, unspecified: Secondary | ICD-10-CM | POA: Diagnosis present

## 2012-05-21 DIAGNOSIS — Z87891 Personal history of nicotine dependence: Secondary | ICD-10-CM

## 2012-05-21 DIAGNOSIS — F411 Generalized anxiety disorder: Secondary | ICD-10-CM | POA: Diagnosis present

## 2012-05-21 DIAGNOSIS — M4712 Other spondylosis with myelopathy, cervical region: Principal | ICD-10-CM | POA: Diagnosis present

## 2012-05-21 HISTORY — PX: ANTERIOR CERVICAL DECOMP/DISCECTOMY FUSION: SHX1161

## 2012-05-21 LAB — GLUCOSE, CAPILLARY
Glucose-Capillary: 109 mg/dL — ABNORMAL HIGH (ref 70–99)
Glucose-Capillary: 124 mg/dL — ABNORMAL HIGH (ref 70–99)

## 2012-05-21 SURGERY — ANTERIOR CERVICAL DECOMPRESSION/DISCECTOMY FUSION 1 LEVEL
Anesthesia: General | Site: Neck | Wound class: Clean

## 2012-05-21 MED ORDER — MORPHINE SULFATE 2 MG/ML IJ SOLN: 1.0000 mg | INTRAMUSCULAR | Status: DC | PRN

## 2012-05-21 MED ORDER — EPHEDRINE SULFATE 50 MG/ML IJ SOLN
INTRAMUSCULAR | Status: DC | PRN
  Administered 2012-05-21: 10 mg via INTRAVENOUS
  Administered 2012-05-21: 5 mg via INTRAVENOUS
  Administered 2012-05-21: 10 mg via INTRAVENOUS
  Administered 2012-05-21: 5 mg via INTRAVENOUS
  Administered 2012-05-21: 10 mg via INTRAVENOUS

## 2012-05-21 MED ORDER — SODIUM CHLORIDE 0.9 % IR SOLN
Status: DC | PRN
  Administered 2012-05-21: 13:00:00

## 2012-05-21 MED ORDER — OMEGA-3-ACID ETHYL ESTERS 1 G PO CAPS
2.0000 g | ORAL_CAPSULE | Freq: Two times a day (BID) | ORAL | Status: DC
  Administered 2012-05-21 – 2012-05-22 (×2): 2 g via ORAL
  Filled 2012-05-21 (×3): qty 2

## 2012-05-21 MED ORDER — SODIUM CHLORIDE 0.9 % IJ SOLN: 3.0000 mL | INTRAMUSCULAR | Status: DC | PRN

## 2012-05-21 MED ORDER — ROCURONIUM BROMIDE 100 MG/10ML IV SOLN
INTRAVENOUS | Status: DC | PRN
  Administered 2012-05-21: 50 mg via INTRAVENOUS

## 2012-05-21 MED ORDER — BUSPIRONE HCL 10 MG PO TABS
20.0000 mg | ORAL_TABLET | Freq: Two times a day (BID) | ORAL | Status: DC
  Administered 2012-05-21 – 2012-05-22 (×2): 20 mg via ORAL
  Filled 2012-05-21 (×4): qty 2

## 2012-05-21 MED ORDER — PROMETHAZINE HCL 25 MG/ML IJ SOLN: 6.2500 mg | INTRAMUSCULAR | Status: DC | PRN

## 2012-05-21 MED ORDER — ACETAMINOPHEN 325 MG PO TABS: 650.0000 mg | ORAL_TABLET | ORAL | Status: DC | PRN

## 2012-05-21 MED ORDER — MIDAZOLAM HCL 5 MG/5ML IJ SOLN
INTRAMUSCULAR | Status: DC | PRN
  Administered 2012-05-21: 2 mg via INTRAVENOUS

## 2012-05-21 MED ORDER — SODIUM CHLORIDE 0.9 % IJ SOLN
3.0000 mL | Freq: Two times a day (BID) | INTRAMUSCULAR | Status: DC
  Administered 2012-05-21 – 2012-05-22 (×2): 3 mL via INTRAVENOUS

## 2012-05-21 MED ORDER — BUPIVACAINE HCL (PF) 0.25 % IJ SOLN
INTRAMUSCULAR | Status: DC | PRN
  Administered 2012-05-21: 3 mL

## 2012-05-21 MED ORDER — SITAGLIPTIN PHOSPHATE 100 MG PO TABS
100.0000 mg | ORAL_TABLET | Freq: Every day | ORAL | Status: DC
  Administered 2012-05-22: 100 mg via ORAL
  Filled 2012-05-21: qty 1

## 2012-05-21 MED ORDER — EZETIMIBE 10 MG PO TABS
10.0000 mg | ORAL_TABLET | Freq: Every day | ORAL | Status: DC
Start: 2012-05-21 — End: 2012-05-22
  Administered 2012-05-21 – 2012-05-22 (×2): 10 mg via ORAL
  Filled 2012-05-21 (×2): qty 1

## 2012-05-21 MED ORDER — OXYCODONE HCL 5 MG PO TABS: 5.0000 mg | ORAL_TABLET | Freq: Once | ORAL | Status: DC | PRN

## 2012-05-21 MED ORDER — SODIUM CHLORIDE 0.9 % IV SOLN
INTRAVENOUS | Status: AC
  Filled 2012-05-21: qty 500

## 2012-05-21 MED ORDER — METHOCARBAMOL 100 MG/ML IJ SOLN
500.0000 mg | Freq: Four times a day (QID) | INTRAVENOUS | Status: DC | PRN
  Filled 2012-05-21: qty 5

## 2012-05-21 MED ORDER — ONDANSETRON HCL 4 MG/2ML IJ SOLN: 4.0000 mg | INTRAMUSCULAR | Status: DC | PRN

## 2012-05-21 MED ORDER — VENLAFAXINE HCL ER 150 MG PO CP24
150.0000 mg | ORAL_CAPSULE | Freq: Every day | ORAL | Status: DC
  Administered 2012-05-22: 150 mg via ORAL
  Filled 2012-05-21 (×2): qty 1

## 2012-05-21 MED ORDER — ONDANSETRON HCL 4 MG/2ML IJ SOLN
INTRAMUSCULAR | Status: DC | PRN
  Administered 2012-05-21: 4 mg via INTRAVENOUS

## 2012-05-21 MED ORDER — PHENOL 1.4 % MT LIQD: 1.0000 | OROMUCOSAL | Status: DC | PRN

## 2012-05-21 MED ORDER — LIDOCAINE-EPINEPHRINE 1 %-1:100000 IJ SOLN
INTRAMUSCULAR | Status: DC | PRN
  Administered 2012-05-21: 3 mL

## 2012-05-21 MED ORDER — ACETAMINOPHEN 650 MG RE SUPP: 650.0000 mg | RECTAL | Status: DC | PRN

## 2012-05-21 MED ORDER — OXYCODONE-ACETAMINOPHEN 5-325 MG PO TABS
1.0000 | ORAL_TABLET | ORAL | Status: DC | PRN
  Administered 2012-05-21: 2 via ORAL
  Filled 2012-05-21: qty 2

## 2012-05-21 MED ORDER — MEPERIDINE HCL 25 MG/ML IJ SOLN: 6.2500 mg | INTRAMUSCULAR | Status: DC | PRN

## 2012-05-21 MED ORDER — PNEUMOCOCCAL VAC POLYVALENT 25 MCG/0.5ML IJ INJ
0.5000 mL | INJECTION | INTRAMUSCULAR | Status: AC
  Administered 2012-05-22: 0.5 mL via INTRAMUSCULAR
  Filled 2012-05-21: qty 0.5

## 2012-05-21 MED ORDER — BUPROPION HCL ER (XL) 300 MG PO TB24
300.0000 mg | ORAL_TABLET | Freq: Every day | ORAL | Status: DC
  Administered 2012-05-22: 300 mg via ORAL
  Filled 2012-05-21 (×2): qty 1

## 2012-05-21 MED ORDER — HEMOSTATIC AGENTS (NO CHARGE) OPTIME
TOPICAL | Status: DC | PRN
  Administered 2012-05-21: 1 via TOPICAL

## 2012-05-21 MED ORDER — PHENYLEPHRINE HCL 10 MG/ML IJ SOLN
INTRAMUSCULAR | Status: DC | PRN
  Administered 2012-05-21: 20 ug via INTRAVENOUS

## 2012-05-21 MED ORDER — CEFAZOLIN SODIUM 1-5 GM-% IV SOLN
1.0000 g | Freq: Three times a day (TID) | INTRAVENOUS | Status: AC
  Administered 2012-05-21 – 2012-05-22 (×2): 1 g via INTRAVENOUS
  Filled 2012-05-21 (×2): qty 50

## 2012-05-21 MED ORDER — MENTHOL 3 MG MT LOZG: 1.0000 | LOZENGE | OROMUCOSAL | Status: DC | PRN

## 2012-05-21 MED ORDER — PROPOFOL 10 MG/ML IV BOLUS
INTRAVENOUS | Status: DC | PRN
  Administered 2012-05-21: 110 mg via INTRAVENOUS

## 2012-05-21 MED ORDER — BACITRACIN 50000 UNITS IM SOLR
INTRAMUSCULAR | Status: AC
  Filled 2012-05-21: qty 1

## 2012-05-21 MED ORDER — CLONAZEPAM 0.5 MG PO TABS
1.0000 mg | ORAL_TABLET | Freq: Three times a day (TID) | ORAL | Status: DC
  Administered 2012-05-21: 1 mg via ORAL
  Filled 2012-05-21: qty 2

## 2012-05-21 MED ORDER — RAMIPRIL 10 MG PO CAPS
10.0000 mg | ORAL_CAPSULE | Freq: Every day | ORAL | Status: DC
  Administered 2012-05-22: 10 mg via ORAL
  Filled 2012-05-21 (×2): qty 1

## 2012-05-21 MED ORDER — LACTATED RINGERS IV SOLN
INTRAVENOUS | Status: DC | PRN
  Administered 2012-05-21 (×2): via INTRAVENOUS

## 2012-05-21 MED ORDER — THROMBIN 5000 UNITS EX SOLR
CUTANEOUS | Status: DC | PRN
  Administered 2012-05-21 (×2): 5000 [IU] via TOPICAL

## 2012-05-21 MED ORDER — NEOSTIGMINE METHYLSULFATE 1 MG/ML IJ SOLN
INTRAMUSCULAR | Status: DC | PRN
  Administered 2012-05-21: 4 mg via INTRAVENOUS

## 2012-05-21 MED ORDER — NON FORMULARY: 100.0000 mg | Freq: Every morning | Status: DC

## 2012-05-21 MED ORDER — OXYCODONE HCL 5 MG/5ML PO SOLN: 5.0000 mg | Freq: Once | ORAL | Status: DC | PRN

## 2012-05-21 MED ORDER — LIDOCAINE HCL (CARDIAC) 20 MG/ML IV SOLN
INTRAVENOUS | Status: DC | PRN
  Administered 2012-05-21: 75 mg via INTRAVENOUS
  Administered 2012-05-21: 25 mg via INTRAVENOUS

## 2012-05-21 MED ORDER — METHOCARBAMOL 500 MG PO TABS
500.0000 mg | ORAL_TABLET | Freq: Four times a day (QID) | ORAL | Status: DC | PRN
  Administered 2012-05-21: 500 mg via ORAL
  Filled 2012-05-21: qty 1

## 2012-05-21 MED ORDER — ISOSORBIDE MONONITRATE ER 60 MG PO TB24
60.0000 mg | ORAL_TABLET | Freq: Every evening | ORAL | Status: DC
  Administered 2012-05-21: 60 mg via ORAL
  Filled 2012-05-21 (×2): qty 1

## 2012-05-21 MED ORDER — ARTIFICIAL TEARS OP OINT
TOPICAL_OINTMENT | OPHTHALMIC | Status: DC | PRN
  Administered 2012-05-21: 1 via OPHTHALMIC

## 2012-05-21 MED ORDER — FENTANYL CITRATE 0.05 MG/ML IJ SOLN
INTRAMUSCULAR | Status: DC | PRN
  Administered 2012-05-21: 50 ug via INTRAVENOUS
  Administered 2012-05-21: 100 ug via INTRAVENOUS
  Administered 2012-05-21 (×2): 50 ug via INTRAVENOUS

## 2012-05-21 MED ORDER — NAPHAZOLINE HCL 0.1 % OP SOLN
1.0000 [drp] | Freq: Every day | OPHTHALMIC | Status: DC | PRN
  Filled 2012-05-21: qty 15

## 2012-05-21 MED ORDER — FENOFIBRATE 160 MG PO TABS
160.0000 mg | ORAL_TABLET | Freq: Every day | ORAL | Status: DC
  Administered 2012-05-21 – 2012-05-22 (×2): 160 mg via ORAL
  Filled 2012-05-21 (×2): qty 1

## 2012-05-21 MED ORDER — ALUM & MAG HYDROXIDE-SIMETH 200-200-20 MG/5ML PO SUSP: 30.0000 mL | Freq: Four times a day (QID) | ORAL | Status: DC | PRN

## 2012-05-21 MED ORDER — MONTELUKAST SODIUM 10 MG PO TABS
10.0000 mg | ORAL_TABLET | Freq: Every day | ORAL | Status: DC
  Administered 2012-05-21: 10 mg via ORAL
  Filled 2012-05-21 (×2): qty 1

## 2012-05-21 MED ORDER — AZELASTINE HCL 0.1 % NA SOLN
2.0000 | Freq: Every day | NASAL | Status: DC
  Administered 2012-05-22: 2 via NASAL
  Filled 2012-05-21: qty 30

## 2012-05-21 MED ORDER — ATORVASTATIN CALCIUM 40 MG PO TABS
40.0000 mg | ORAL_TABLET | Freq: Every evening | ORAL | Status: DC
Start: 2012-05-21 — End: 2012-05-22
  Administered 2012-05-21: 40 mg via ORAL
  Filled 2012-05-21 (×2): qty 1

## 2012-05-21 MED ORDER — 0.9 % SODIUM CHLORIDE (POUR BTL) OPTIME
TOPICAL | Status: DC | PRN
  Administered 2012-05-21: 1000 mL

## 2012-05-21 MED ORDER — HYDROCHLOROTHIAZIDE 25 MG PO TABS
25.0000 mg | ORAL_TABLET | Freq: Every day | ORAL | Status: DC
Start: 2012-05-22 — End: 2012-05-22
  Administered 2012-05-22: 25 mg via ORAL
  Filled 2012-05-21 (×2): qty 1

## 2012-05-21 MED ORDER — GLYCOPYRROLATE 0.2 MG/ML IJ SOLN
INTRAMUSCULAR | Status: DC | PRN
  Administered 2012-05-21: 0.2 mg via INTRAVENOUS
  Administered 2012-05-21: 0.6 mg via INTRAVENOUS

## 2012-05-21 MED ORDER — HYDROMORPHONE HCL PF 1 MG/ML IJ SOLN: 0.2500 mg | INTRAMUSCULAR | Status: DC | PRN

## 2012-05-21 MED ORDER — ALPRAZOLAM 0.5 MG PO TABS: 1.0000 mg | ORAL_TABLET | Freq: Every evening | ORAL | Status: DC | PRN

## 2012-05-21 MED ORDER — NITROGLYCERIN 0.4 MG SL SUBL: 0.4000 mg | SUBLINGUAL_TABLET | SUBLINGUAL | Status: DC | PRN

## 2012-05-21 SURGICAL SUPPLY — 64 items
BAG DECANTER FOR FLEXI CONT (MISCELLANEOUS) ×2 IMPLANT
BANDAGE GAUZE ELAST BULKY 4 IN (GAUZE/BANDAGES/DRESSINGS) IMPLANT
BIT DRILL 2.3 12 FIXED (INSTRUMENTS) ×1 IMPLANT
BIT DRILL NEURO 2X3.1 SFT TUCH (MISCELLANEOUS) ×1 IMPLANT
BUR BARREL STRAIGHT FLUTE 4.0 (BURR) ×2 IMPLANT
CANISTER SUCTION 2500CC (MISCELLANEOUS) ×2 IMPLANT
CHLORAPREP W/TINT 26ML (MISCELLANEOUS) ×2 IMPLANT
CLOTH BEACON ORANGE TIMEOUT ST (SAFETY) ×2 IMPLANT
CONT SPEC 4OZ CLIKSEAL STRL BL (MISCELLANEOUS) ×4 IMPLANT
DECANTER SPIKE VIAL GLASS SM (MISCELLANEOUS) ×2 IMPLANT
DERMABOND ADHESIVE PROPEN (GAUZE/BANDAGES/DRESSINGS) ×1
DERMABOND ADVANCED (GAUZE/BANDAGES/DRESSINGS)
DERMABOND ADVANCED .7 DNX12 (GAUZE/BANDAGES/DRESSINGS) IMPLANT
DERMABOND ADVANCED .7 DNX6 (GAUZE/BANDAGES/DRESSINGS) ×1 IMPLANT
DRAPE INCISE 23X17 IOBAN STRL (DRAPES) ×1
DRAPE INCISE IOBAN 23X17 STRL (DRAPES) ×1 IMPLANT
DRAPE LAPAROTOMY 100X72 PEDS (DRAPES) ×2 IMPLANT
DRAPE MICROSCOPE LEICA (MISCELLANEOUS) IMPLANT
DRAPE POUCH INSTRU U-SHP 10X18 (DRAPES) ×2 IMPLANT
DRESSING TELFA 8X3 (GAUZE/BANDAGES/DRESSINGS) IMPLANT
DRILL 12MM (INSTRUMENTS) ×2
DRILL NEURO 2X3.1 SOFT TOUCH (MISCELLANEOUS) ×2
DRSG OPSITE 4X5.5 SM (GAUZE/BANDAGES/DRESSINGS) IMPLANT
DURAPREP 6ML APPLICATOR 50/CS (WOUND CARE) IMPLANT
ELECT REM PT RETURN 9FT ADLT (ELECTROSURGICAL) ×2
ELECTRODE REM PT RTRN 9FT ADLT (ELECTROSURGICAL) ×1 IMPLANT
GAUZE SPONGE 4X4 16PLY XRAY LF (GAUZE/BANDAGES/DRESSINGS) IMPLANT
GLOVE BIO SURGEON STRL SZ7.5 (GLOVE) IMPLANT
GLOVE BIOGEL PI IND STRL 7.0 (GLOVE) ×2 IMPLANT
GLOVE BIOGEL PI IND STRL 7.5 (GLOVE) IMPLANT
GLOVE BIOGEL PI IND STRL 8.5 (GLOVE) ×1 IMPLANT
GLOVE BIOGEL PI INDICATOR 7.0 (GLOVE) ×2
GLOVE BIOGEL PI INDICATOR 7.5 (GLOVE)
GLOVE BIOGEL PI INDICATOR 8.5 (GLOVE) ×1
GLOVE ECLIPSE 8.5 STRL (GLOVE) ×2 IMPLANT
GLOVE EXAM NITRILE LRG STRL (GLOVE) IMPLANT
GLOVE EXAM NITRILE MD LF STRL (GLOVE) IMPLANT
GLOVE EXAM NITRILE XL STR (GLOVE) IMPLANT
GLOVE EXAM NITRILE XS STR PU (GLOVE) IMPLANT
GLOVE SS BIOGEL STRL SZ 6.5 (GLOVE) ×3 IMPLANT
GLOVE SUPERSENSE BIOGEL SZ 6.5 (GLOVE) ×3
GOWN BRE IMP SLV AUR LG STRL (GOWN DISPOSABLE) ×2 IMPLANT
GOWN BRE IMP SLV AUR XL STRL (GOWN DISPOSABLE) ×2 IMPLANT
GOWN STRL REIN 2XL LVL4 (GOWN DISPOSABLE) ×2 IMPLANT
HEAD HALTER (SOFTGOODS) ×2 IMPLANT
KIT BASIN OR (CUSTOM PROCEDURE TRAY) ×2 IMPLANT
KIT ROOM TURNOVER OR (KITS) ×2 IMPLANT
NEEDLE HYPO 22GX1.5 SAFETY (NEEDLE) ×2 IMPLANT
NEEDLE SPNL 22GX3.5 QUINCKE BK (NEEDLE) IMPLANT
NS IRRIG 1000ML POUR BTL (IV SOLUTION) ×2 IMPLANT
PACK LAMINECTOMY NEURO (CUSTOM PROCEDURE TRAY) ×2 IMPLANT
PAD ARMBOARD 7.5X6 YLW CONV (MISCELLANEOUS) ×6 IMPLANT
PLATE 14MM (Plate) ×2 IMPLANT
PUTTY BONE 2.5CC ×2 IMPLANT
RUBBERBAND STERILE (MISCELLANEOUS) IMPLANT
SCREW 12MM (Screw) ×8 IMPLANT
SPACER PAR CORT CERV S (Spacer) ×4 IMPLANT
SPONGE INTESTINAL PEANUT (DISPOSABLE) ×2 IMPLANT
SPONGE SURGIFOAM ABS GEL SZ50 (HEMOSTASIS) ×2 IMPLANT
SUT VIC AB 3-0 SH 8-18 (SUTURE) ×4 IMPLANT
SYR 20ML ECCENTRIC (SYRINGE) ×2 IMPLANT
TOWEL OR 17X24 6PK STRL BLUE (TOWEL DISPOSABLE) ×2 IMPLANT
TOWEL OR 17X26 10 PK STRL BLUE (TOWEL DISPOSABLE) ×2 IMPLANT
WATER STERILE IRR 1000ML POUR (IV SOLUTION) ×2 IMPLANT

## 2012-05-21 NOTE — Preoperative (Signed)
Beta Blockers   Reason not to administer Beta Blockers:Not Applicable 

## 2012-05-21 NOTE — Anesthesia Preprocedure Evaluation (Signed)
Anesthesia Evaluation  Patient identified by MRN, date of birth, ID band Patient awake    Reviewed: Allergy & Precautions, H&P , NPO status   History of Anesthesia Complications Negative for: history of anesthetic complications  Airway Mallampati: II      Dental   Pulmonary shortness of breath, sleep apnea ,  breath sounds clear to auscultation        Cardiovascular hypertension, + CAD Rhythm:Regular Rate:Normal     Neuro/Psych    GI/Hepatic   Endo/Other  diabetes  Renal/GU      Musculoskeletal negative musculoskeletal ROS (+)   Abdominal   Peds  Hematology negative hematology ROS (+)   Anesthesia Other Findings   Reproductive/Obstetrics                           Anesthesia Physical Anesthesia Plan  ASA: III  Anesthesia Plan: General   Post-op Pain Management:    Induction: Intravenous  Airway Management Planned: Oral ETT  Additional Equipment:   Intra-op Plan:   Post-operative Plan: Extubation in OR  Informed Consent:   Dental advisory given  Plan Discussed with: CRNA and Surgeon  Anesthesia Plan Comments:         Anesthesia Quick Evaluation

## 2012-05-21 NOTE — Progress Notes (Signed)
Patient ID: [name], female   DOB: 1945-11-27, 67 y.o.   MRN: [id number] Alert right arm feels better motor function is good and intact incision is clean and dry.  Stable postop

## 2012-05-21 NOTE — Op Note (Signed)
Date of surgery:@T @ Preoperative diagnosis: Cervical spondylosis with radiculopathy C5 CC Post operative diagnosis: Cervical spondylosis with radiculopathy C5-C6 Procedure: Anterior cervical discectomy decompression of nerve roots and spinal canal C5-C6 arthrodesis with structural allograft, Alphatec plate fixation Z6-X0 Surgeon: Barnett Abu M.D. Asst.: Colon Branch Indications: Patient is a 67 year old individual whom of note for a longer time she developed neck pain right shoulder and arm pain with weakness she has evidence of a large herniated nucleus pulposus in the foramen at C5-C6 in addition to severely degenerated disc.   Procedure: The patient was brought to the operating room placed on the table in supine position. After the smooth induction of general endotracheal anesthesia neck was placed in 5 pounds of halter traction and prepped with alcohol and DuraPrep. After sterile draping and appropriate timeout procedure a transverse incision was created in the left side of the neck and carried down to the platysma. The plane between the sternocleidomastoid and strap muscles dissected bluntly until the prevertebral space was reached. The first identifiable disc space was noted to be C4-5on a localizing radiograph. The dissection was then undertaken in the longus coli muscle to allow placement of a self-retaining Caspar type retractor.  The anterior longitudinal ligament was opened at C5-C6 and ventral osteophytes were removed with a Leksell rongeur and Kerrison punch. Interspace was cleared of significant quantity of the degenerated disc material in the region of the posterior longitudinal ligament was removed. Dissection was carried out using a high-speed drill and 3-0 Karlin curettes. Uncinate processes were drilled down and removed and osteophytes from the inferior margin of the body of C5 were removed with a Kerrison 2 mm gold punch. After the central canal and lateral recesses were well  decompressed hemostasis was achieved with the bipolar cautery and some small pledgets of Gelfoam soaked in thrombin that were later irrigated away.  A 7 mm cortical ring allograft was then prepared by enlarging the central cavity and filling with demineralized bone matrix and placing into the interspace.  Next the retractor was removed and a 13 mm trestle plate was placed over the vertebral bodies and secured with 12 mm variable angle screws. A final localizing radiograph identified the position of the surgical construct. The stasis was achieved in the soft tissues and then the platysma was closed with 3-0 Vicryl in an interrupted fashion and 3-0 Vicryl was used in the subcuticular tissue. Blood loss was estimated at 20 cc.

## 2012-05-21 NOTE — Transfer of Care (Signed)
Immediate Anesthesia Transfer of Care Note  Patient: [name]  Procedure(s) Performed: Procedure(s) with comments: CERVICAL FIVE-SIX ANTERIOR CERVICAL DECOMPRESSION/DISCECTOMY FUSION  (N/A) - Cervical five-six Anterior cervical decompression/diskectomy/fusion  Patient Location: PACU  Anesthesia Type:General  Level of Consciousness: awake, alert  and oriented  Airway & Oxygen Therapy: Patient Spontanous Breathing  Post-op Assessment: Report given to PACU RN, Post -op Vital signs reviewed and stable and Patient moving all extremities X 4  Post vital signs: Reviewed and stable  Complications: No apparent anesthesia complications

## 2012-05-21 NOTE — Progress Notes (Signed)
INCISION SITE AT ANTERIOR NECK STARTING TO HAVE SOME MILD SWELLING.  NO PROBLEMS SWALLOWING OR TALKING. ICEPACK  PLACED ON SITE AND DR. HIRSCH PAGED WITH ORDERS RECEIVED TO MONITOR PT. CLOSELY TONIGHT.   CHRIS Perrin Gens RN

## 2012-05-21 NOTE — Anesthesia Postprocedure Evaluation (Signed)
  Anesthesia Post-op Note  Patient: [name]  Procedure(s) Performed: Procedure(s) with comments: CERVICAL FIVE-SIX ANTERIOR CERVICAL DECOMPRESSION/DISCECTOMY FUSION  (N/A) - Cervical five-six Anterior cervical decompression/diskectomy/fusion  Patient Location: PACU  Anesthesia Type:General  Level of Consciousness: awake  Airway and Oxygen Therapy: Patient Spontanous Breathing  Post-op Pain: mild  Post-op Assessment: Post-op Vital signs reviewed  Post-op Vital Signs: stable  Complications: No apparent anesthesia complications

## 2012-05-21 NOTE — H&P (Signed)
[name] is an 67 y.o. female.   Chief Complaint: neck shoulder arm pain and weakness. HPI: 67 y.o lady that I have followed for many years for a small unruptured mca aneurysm.  She has developed substantial neck pain and weakness in her deltoids and her arms. An MRi demonstrates advanced degenerative changes at C5-6. She is admitted for surgical decompression and stabilization.  Past Medical History  Diagnosis Date  . Family history of anesthesia complication     brother has been agitated /w anesth.   . Hypertension   . Depression   . Shortness of breath     h/o  . Sleep apnea     had study 12 yrs.ago, used CPAP/w O2 4 yrs. , last study- several yrs. ago, but it was noted then that she no longer needed CPAP  . Diabetes mellitus without complication   . Stroke     betwn 2007-2009, MRI, had a stroke L side of brain ,  brain aneurysm on L side- CT- done q 5 yrs.   . Neuromuscular disorder     neuropathy- legs, feet   . Arthritis     cerv. spondylosis   . Coronary artery disease     followed by Dr. Allyson Sabal, reports that she was last seen 1 month ago,, stress test planned for week of 05/13/2012   . Anxiety     sees Dr. Phillip Heal, with multiple antianxiety / antidepressants in use   . Chronic kidney disease     increased creatinine- Dr. Hyman Hopes sees pt. & the elevation was related to  meds she was on    Past Surgical History  Procedure Laterality Date  . Cardiac catheterization      stent placed- 2006- [facility]  . Abdominal hysterectomy    . Joint replacement      bilateral   . Cholecystectomy      laparoscopic approach    No family history on file. Social History:  reports that she quit smoking about 5 weeks ago. Her smoking use included Cigarettes. She smoked 0.00 packs per day. She does not have any smokeless tobacco history on file. She reports that she does not drink alcohol or use illicit drugs.  Allergies:  Allergies  Allergen Reactions  . Demerol (Meperidine)     Spots  on top layer of skin  . Macrodantin (Nitrofurantoin Macrocrystal)     Unknown   . Morphine And Related     Go crazy  . Shrimp (Shellfish Allergy)     itching    No prescriptions prior to admission    No results found for this or any previous visit (from the past 48 hour(s)). No results found.  Review of Systems  Constitutional: Negative.   HENT: Positive for neck pain.   Eyes: Negative.   Respiratory: Negative.   Cardiovascular: Negative.   Gastrointestinal: Negative.   Genitourinary: Negative.   Skin: Negative.   Neurological: Positive for focal weakness.  Endo/Heme/Allergies: Negative.   Psychiatric/Behavioral: Negative.     There were no vitals taken for this visit. Physical Exam  Constitutional: She is oriented to person, place, and time. She appears well-developed and well-nourished.  HENT:  Head: Normocephalic and atraumatic.  Eyes: Conjunctivae and EOM are normal. Pupils are equal, round, and reactive to light.  Neck:  Pain on palpation in supra clavicular fossae. Limited turning left and right about 70 % normal pain w flection.  Cardiovascular: Normal rate and regular rhythm.   Respiratory: Effort normal and  breath sounds normal.  GI: Soft. Bowel sounds are normal.  Musculoskeletal:  wekness of both deltoids 4-. Biceps stregnth decreased. sensationdecreased in C-6  Neurological: She is alert and oriented to person, place, and time. She displays abnormal reflex. No cranial nerve deficit.  Skin: Skin is warm and dry.  Psychiatric: She has a normal mood and affect. Her behavior is normal. Judgment and thought content normal.     Assessment/Plan C5-6 advanced spondylosis with cord and nerve root compromise. For anterior decompression and fusion.   Jameisha Stofko J 05/21/2012, 6:23 AM

## 2012-05-21 NOTE — Plan of Care (Signed)
Problem: Consults Goal: Diagnosis - Spinal Surgery Outcome: Completed/Met Date Met:  05/21/12 Cervical Spine Fusion     

## 2012-05-22 ENCOUNTER — Encounter (HOSPITAL_COMMUNITY): Payer: Self-pay | Admitting: Neurological Surgery

## 2012-05-22 LAB — GLUCOSE, CAPILLARY
Glucose-Capillary: 107 mg/dL — ABNORMAL HIGH (ref 70–99)
Glucose-Capillary: 152 mg/dL — ABNORMAL HIGH (ref 70–99)

## 2012-05-22 MED ORDER — METHOCARBAMOL 500 MG PO TABS: 500.0000 mg | ORAL_TABLET | Freq: Four times a day (QID) | ORAL | Status: DC | PRN

## 2012-05-22 MED ORDER — OXYCODONE-ACETAMINOPHEN 5-325 MG PO TABS: 1.0000 | ORAL_TABLET | ORAL | Status: DC | PRN

## 2012-05-22 NOTE — Progress Notes (Signed)
UR COMPLETED  

## 2012-05-22 NOTE — Discharge Summary (Signed)
Physician Discharge Summary  Patient ID: [name] MRN: [id number] DOB/AGE: 09/03/1945 67 y.o.  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 She tolerated the surgery well  Consults: None  Significant Diagnostic Studies: None  Treatments: surgery: Anterior cervical decompression arthrodesis C5-C6 structural allograft anterior plate fixation  Discharge Exam: Blood pressure 156/99, pulse 65, temperature 97.9 F (36.6 C), temperature source Oral, resp. rate 18, SpO2 94.00%. Motor function intact slight right deltoid weakness.  Disposition:   Discharge Orders   Future Orders Complete By Expires     Call MD for:  redness, tenderness, or signs of infection (pain, swelling, redness, odor or green/yellow discharge around incision site)  As directed     Call MD for:  severe uncontrolled pain  As directed     Call MD for:  temperature >100.4  As directed     Diet - low sodium heart healthy  As directed     Discharge instructions  As directed     Comments:      Okay to shower. Do not apply salves or appointments to incision. No heavy lifting with the upper extremities greater than 15 pounds. May resume driving when not requiring pain medication and patient feels comfortable with doing so.    Increase activity slowly  As directed         Medication List    TAKE these medications       acetaminophen 500 MG tablet  Commonly known as:  TYLENOL  Take 1,000 mg by mouth every 6 (six) hours as needed for pain.     ALPRAZolam 1 MG tablet  Commonly known as:  XANAX  Take 1 mg by mouth  at bedtime as needed for sleep. For sleep     aspirin 81 MG tablet  Take 81 mg by mouth at bedtime.     atorvastatin 40 MG tablet  Commonly known as:  LIPITOR  Take 40 mg by mouth every evening.     azelastine 137 MCG/SPRAY nasal spray  Commonly known as:  ASTELIN  Place 2 sprays into the nose daily before breakfast. Use in each nostril as directed     buPROPion 300 MG 24 hr tablet  Commonly known as:  WELLBUTRIN XL  Take 300 mg by mouth daily with breakfast.     busPIRone 10 MG tablet  Commonly known as:  BUSPAR  Take 20 mg by mouth 2 (two) times daily.     cholecalciferol 1000 UNITS tablet  Commonly known as:  VITAMIN D  Take 1,000 Units by mouth daily.     clonazePAM 2 MG tablet  Commonly known as:  KLONOPIN  Take 1 mg by mouth 3 (three) times daily.     clopidogrel 75 MG tablet  Commonly known as:  PLAVIX  Take 75 mg by mouth at bedtime.     ezetimibe 10 MG tablet  Commonly known as:  ZETIA  Take 10 mg by mouth daily.     fenofibrate 160 MG tablet  Take 160 mg by mouth daily.     folic acid 1 MG tablet  Commonly known as:  FOLVITE  Take 1 mg by mouth daily.  hydrochlorothiazide 25 MG tablet  Commonly known as:  HYDRODIURIL  Take 25 mg by mouth daily with breakfast.     isosorbide mononitrate 60 MG 24 hr tablet  Commonly known as:  IMDUR  Take 60 mg by mouth every evening.     methocarbamol 500 MG tablet  Commonly known as:  ROBAXIN  Take 1 tablet (500 mg total) by mouth every 6 (six) hours as needed.     montelukast 10 MG tablet  Commonly known as:  SINGULAIR  Take 10 mg by mouth at bedtime.     nabumetone 500 MG tablet  Commonly known as:  RELAFEN  Take 500 mg by mouth at bedtime.     naphazoline 0.1 % ophthalmic solution  Commonly known as:  NAPHCON  Place 1 drop into both eyes daily as needed. For itching eyes     naproxen sodium 220 MG tablet  Commonly known as:  ANAPROX  Take 220 mg by mouth 2 (two) times daily with a meal.      nitroGLYCERIN 0.4 MG SL tablet  Commonly known as:  NITROSTAT  Place 0.4 mg under the tongue every 5 (five) minutes as needed for chest pain. For chest pain     omega-3 acid ethyl esters 1 G capsule  Commonly known as:  LOVAZA  Take 2 g by mouth 2 (two) times daily.     oxyCODONE-acetaminophen 5-325 MG per tablet  Commonly known as:  PERCOCET/ROXICET  Take 1-2 tablets by mouth every 4 (four) hours as needed for pain.     ramipril 10 MG capsule  Commonly known as:  ALTACE  Take 10 mg by mouth daily with breakfast.     sitaGLIPtin 100 MG tablet  Commonly known as:  JANUVIA  Take 100 mg by mouth daily.     venlafaxine XR 150 MG 24 hr capsule  Commonly known as:  EFFEXOR-XR  Take 150 mg by mouth daily with breakfast.         Signed: Stefani Dama 05/22/2012, 9:14 AM

## 2012-07-27 ENCOUNTER — Other Ambulatory Visit (HOSPITAL_COMMUNITY): Payer: Self-pay | Admitting: Cardiovascular Disease

## 2012-07-30 NOTE — Telephone Encounter (Signed)
Rx was sent to pharmacy electronically. 

## 2012-11-21 ENCOUNTER — Telehealth (HOSPITAL_COMMUNITY): Payer: Self-pay | Admitting: *Deleted

## 2012-11-27 ENCOUNTER — Other Ambulatory Visit (HOSPITAL_COMMUNITY): Payer: Self-pay | Admitting: Neurological Surgery

## 2012-11-27 DIAGNOSIS — I671 Cerebral aneurysm, nonruptured: Secondary | ICD-10-CM

## 2012-11-29 ENCOUNTER — Ambulatory Visit (HOSPITAL_COMMUNITY): Payer: Medicare Other

## 2012-12-03 ENCOUNTER — Ambulatory Visit (HOSPITAL_COMMUNITY)
Admission: RE | Admit: 2012-12-03 | Discharge: 2012-12-03 | Disposition: A | Discharge status: Home / Self Care | Payer: Medicare Other | Source: Ambulatory Visit | Attending: Neurological Surgery | Admitting: Neurological Surgery

## 2012-12-03 DIAGNOSIS — G9389 Other specified disorders of brain: Secondary | ICD-10-CM | POA: Insufficient documentation

## 2012-12-03 DIAGNOSIS — I6789 Other cerebrovascular disease: Secondary | ICD-10-CM | POA: Insufficient documentation

## 2012-12-03 DIAGNOSIS — Z9181 History of falling: Secondary | ICD-10-CM | POA: Insufficient documentation

## 2012-12-03 DIAGNOSIS — R42 Dizziness and giddiness: Secondary | ICD-10-CM | POA: Insufficient documentation

## 2012-12-03 DIAGNOSIS — I671 Cerebral aneurysm, nonruptured: Secondary | ICD-10-CM

## 2012-12-03 LAB — BUN: BUN: 24 mg/dL — ABNORMAL HIGH (ref 6–23)

## 2012-12-03 MED ORDER — IOHEXOL 350 MG/ML SOLN
50.0000 mL | Freq: Once | INTRAVENOUS | Status: AC | PRN
  Administered 2012-12-03: 50 mL via INTRAVENOUS

## 2012-12-04 ENCOUNTER — Other Ambulatory Visit (HOSPITAL_COMMUNITY): Payer: Self-pay | Admitting: Neurological Surgery

## 2012-12-04 DIAGNOSIS — M4302 Spondylolysis, cervical region: Secondary | ICD-10-CM

## 2012-12-04 DIAGNOSIS — G959 Disease of spinal cord, unspecified: Secondary | ICD-10-CM

## 2012-12-10 ENCOUNTER — Ambulatory Visit (HOSPITAL_COMMUNITY)
Admission: RE | Admit: 2012-12-10 | Discharge: 2012-12-10 | Disposition: A | Discharge status: Home / Self Care | Payer: Medicare Other | Source: Ambulatory Visit | Attending: Neurological Surgery | Admitting: Neurological Surgery

## 2012-12-10 DIAGNOSIS — K111 Hypertrophy of salivary gland: Secondary | ICD-10-CM | POA: Insufficient documentation

## 2012-12-10 DIAGNOSIS — M25519 Pain in unspecified shoulder: Secondary | ICD-10-CM | POA: Insufficient documentation

## 2012-12-10 DIAGNOSIS — M4302 Spondylolysis, cervical region: Secondary | ICD-10-CM

## 2012-12-10 DIAGNOSIS — M7130 Other bursal cyst, unspecified site: Secondary | ICD-10-CM | POA: Insufficient documentation

## 2012-12-10 DIAGNOSIS — G959 Disease of spinal cord, unspecified: Secondary | ICD-10-CM

## 2012-12-10 DIAGNOSIS — Z981 Arthrodesis status: Secondary | ICD-10-CM | POA: Insufficient documentation

## 2012-12-10 DIAGNOSIS — M79609 Pain in unspecified limb: Secondary | ICD-10-CM | POA: Insufficient documentation

## 2012-12-10 DIAGNOSIS — M47812 Spondylosis without myelopathy or radiculopathy, cervical region: Secondary | ICD-10-CM | POA: Insufficient documentation

## 2012-12-11 ENCOUNTER — Encounter: Payer: Self-pay | Admitting: Cardiovascular Disease

## 2012-12-11 ENCOUNTER — Ambulatory Visit (INDEPENDENT_AMBULATORY_CARE_PROVIDER_SITE_OTHER): Payer: Medicare Other | Admitting: Cardiovascular Disease

## 2012-12-11 VITALS — BP 180/92 | HR 65 | Ht 61.0 in | Wt 182.0 lb

## 2012-12-11 DIAGNOSIS — I2510 Atherosclerotic heart disease of native coronary artery without angina pectoris: Secondary | ICD-10-CM

## 2012-12-11 DIAGNOSIS — E785 Hyperlipidemia, unspecified: Secondary | ICD-10-CM

## 2012-12-11 DIAGNOSIS — I10 Essential (primary) hypertension: Secondary | ICD-10-CM

## 2012-12-11 DIAGNOSIS — E119 Type 2 diabetes mellitus without complications: Secondary | ICD-10-CM | POA: Insufficient documentation

## 2012-12-11 NOTE — Patient Instructions (Signed)
Follow up with Dr Berry as needed.  

## 2012-12-11 NOTE — Assessment & Plan Note (Signed)
On multiple antihypertensive medications. Her blood pressure is elevated today because of neck pain

## 2012-12-11 NOTE — Assessment & Plan Note (Signed)
On statin therapy followed by her PCP 

## 2012-12-11 NOTE — Progress Notes (Signed)
12/11/2012 [name]   05-15-1945  [id number]  Primary Physician Pcp Not In System Primary Cardiologist: Runell Gess MD [name]   HPI: The patient is a 67 year old, mildly overweight, recently widowed (husband of 52 years died in May 29, 2022 of this year of cancer) Caucasian female, mother of 3, grandmother to 2 grandchildren who I saw a year ago. She has a history of CAD status post RCA stenting remotely. She was last cath'd by myself March 2010 revealing a patent stent with no other significant disease and normal LV function. She has smoked in the past and has quite except for periods when she is stressed. Her other problems include type 2 diabetes, hypertension and dyslipidemia. She has seen Dr. Lavonia Drafts, lipidologist, in the past. She has otherwise been asymptomatic.     Current Outpatient Prescriptions  Medication Sig Dispense Refill  . acetaminophen (TYLENOL) 500 MG tablet Take 1,000 mg by mouth every 6 (six) hours as needed for pain.      Marland Kitchen ALPRAZolam (XANAX) 1 MG tablet Take 1 mg by mouth at bedtime as needed for sleep. For sleep      . aspirin 81 MG tablet Take 81 mg by mouth at bedtime.       Marland Kitchen atenolol (TENORMIN) 50 MG tablet Take 100 mg by mouth 2 (two) times daily.      Marland Kitchen atorvastatin (LIPITOR) 40 MG tablet TAKE 1 TABLET DAILY AT BEDTIME  90 tablet  1  . azelastine (ASTELIN) 137 MCG/SPRAY nasal spray Place 2 sprays into the nose daily before breakfast. Use in each nostril as directed      . buPROPion (WELLBUTRIN XL) 300 MG 24 hr tablet Take 300 mg by mouth daily with breakfast.       . busPIRone (BUSPAR) 10 MG tablet Take 20 mg by mouth 2 (two) times daily.       . cholecalciferol (VITAMIN D) 1000 UNITS tablet Take 1,000 Units by mouth daily.      . Cholecalciferol (VITAMIN D3) 5000 UNITS CAPS Take 5,000 Units by mouth every 7 (seven) days.      . clonazePAM (KLONOPIN) 1 MG tablet Take 0.5-1 mg by mouth 3 (three) times daily.      . clopidogrel  (PLAVIX) 75 MG tablet Take 75 mg by mouth at bedtime.       Marland Kitchen ezetimibe (ZETIA) 10 MG tablet Take 10 mg by mouth daily.      . fenofibrate 160 MG tablet Take 160 mg by mouth daily.      . folic acid (FOLVITE) 1 MG tablet Take 1 mg by mouth daily.      . hydrochlorothiazide (HYDRODIURIL) 25 MG tablet Take 25 mg by mouth daily with breakfast.       . isosorbide mononitrate (IMDUR) 60 MG 24 hr tablet Take 60 mg by mouth every evening.      . montelukast (SINGULAIR) 10 MG tablet Take 10 mg by mouth at bedtime.      . nabumetone (RELAFEN) 500 MG tablet Take 500 mg by mouth 2 (two) times daily.       . naphazoline (NAPHCON) 0.1 % ophthalmic solution Place 1 drop into both eyes daily as needed. For itching eyes      . naproxen sodium (ANAPROX) 220 MG tablet Take 220 mg by mouth as needed.       . nitroGLYCERIN (NITROSTAT) 0.4 MG SL tablet Place 0.4 mg under the tongue every 5 (five) minutes as needed  for chest pain. For chest pain      . omega-3 acid ethyl esters (LOVAZA) 1 G capsule Take 2 g by mouth 2 (two) times daily.       . ramipril (ALTACE) 10 MG capsule Take 20 mg by mouth daily with breakfast.       . sitaGLIPtin (JANUVIA) 100 MG tablet Take 100 mg by mouth daily.      Marland Kitchen venlafaxine XR (EFFEXOR-XR) 150 MG 24 hr capsule Take 150 mg by mouth daily with breakfast.        No current facility-administered medications for this visit.    Allergies  Allergen Reactions  . Demerol [Meperidine]     Spots on top layer of skin  . Macrodantin [Nitrofurantoin Macrocrystal]     Unknown   . Morphine And Related     Go crazy  . Shrimp [Shellfish Allergy]     itching    History   Social History  . Marital Status: Married    Spouse Name: N/A    Number of Children: N/A  . Years of Education: N/A   Occupational History  . Not on file.   Social History Main Topics  . Smoking status: Former Smoker    Types: Cigarettes    Quit date: 04/12/2012  . Smokeless tobacco: Never Used  . Alcohol Use:  No  . Drug Use: No  . Sexual Activity: Not on file   Other Topics Concern  . Not on file   Social History Narrative  . No narrative on file     Review of Systems: General: negative for chills, fever, night sweats or weight changes.  Cardiovascular: negative for chest pain, dyspnea on exertion, edema, orthopnea, palpitations, paroxysmal nocturnal dyspnea or shortness of breath Dermatological: negative for rash Respiratory: negative for cough or wheezing Urologic: negative for hematuria Abdominal: negative for nausea, vomiting, diarrhea, bright red blood per rectum, melena, or hematemesis Neurologic: negative for visual changes, syncope, or dizziness All other systems reviewed and are otherwise negative except as noted above.    Blood pressure 180/92, pulse 65, height 5\' 1"  (1.549 m), weight 182 lb (82.555 kg).  General appearance: alert and no distress Neck: no adenopathy, no carotid bruit, no JVD, supple, symmetrical, trachea midline and thyroid not enlarged, symmetric, no tenderness/mass/nodules Lungs: clear to auscultation bilaterally Heart: regular rate and rhythm, S1, S2 normal, no murmur, click, rub or gallop Extremities: extremities normal, atraumatic, no cyanosis or edema  EKG /65 without ST or T wave changes  ASSESSMENT AND PLAN:   Hyperlipidemia On statin therapy followed by her PCP  Essential hypertension On multiple antihypertensive medications. Her blood pressure is elevated today because of neck pain  Coronary artery disease History of CAD status post RCA stenting remotely. I catheter March 2000 revealing a patent stent but otherwise no significant CAD and normal LV function. She denies chest pain or shortness of breath.      Runell Gess MD FACP,FACC,FAHA, [facility] 12/11/2012 4:42 PM

## 2012-12-11 NOTE — Assessment & Plan Note (Signed)
History of CAD status post RCA stenting remotely. I catheter March 2000 revealing a patent stent but otherwise no significant CAD and normal LV function. She denies chest pain or shortness of breath.

## 2012-12-20 ENCOUNTER — Ambulatory Visit: Payer: Medicare Other | Admitting: Rehabilitative and Restorative Service Providers"

## 2012-12-23 ENCOUNTER — Ambulatory Visit: Payer: Medicare Other | Admitting: Rehabilitative and Restorative Service Providers"

## 2012-12-24 ENCOUNTER — Ambulatory Visit: Payer: Medicare Other | Attending: Otolaryngology | Admitting: Physical Therapy

## 2012-12-24 DIAGNOSIS — R269 Unspecified abnormalities of gait and mobility: Secondary | ICD-10-CM | POA: Insufficient documentation

## 2012-12-24 DIAGNOSIS — IMO0001 Reserved for inherently not codable concepts without codable children: Secondary | ICD-10-CM | POA: Insufficient documentation

## 2012-12-24 DIAGNOSIS — H8110 Benign paroxysmal vertigo, unspecified ear: Secondary | ICD-10-CM | POA: Insufficient documentation

## 2012-12-26 ENCOUNTER — Ambulatory Visit: Payer: Medicare Other | Admitting: Rehabilitative and Restorative Service Providers"

## 2012-12-30 ENCOUNTER — Ambulatory Visit: Payer: Medicare Other | Admitting: Rehabilitative and Restorative Service Providers"

## 2013-01-07 ENCOUNTER — Ambulatory Visit: Payer: Medicare Other | Attending: Otolaryngology | Admitting: Physical Therapy

## 2013-01-07 DIAGNOSIS — R269 Unspecified abnormalities of gait and mobility: Secondary | ICD-10-CM | POA: Insufficient documentation

## 2013-01-07 DIAGNOSIS — IMO0001 Reserved for inherently not codable concepts without codable children: Secondary | ICD-10-CM | POA: Insufficient documentation

## 2013-01-07 DIAGNOSIS — H8110 Benign paroxysmal vertigo, unspecified ear: Secondary | ICD-10-CM | POA: Insufficient documentation

## 2013-01-09 ENCOUNTER — Ambulatory Visit: Payer: Medicare Other | Admitting: Physical Therapy

## 2013-01-14 ENCOUNTER — Encounter: Payer: Medicare Other | Admitting: Rehabilitative and Restorative Service Providers"

## 2013-01-16 ENCOUNTER — Encounter: Payer: Medicare Other | Admitting: Rehabilitative and Restorative Service Providers"

## 2013-01-27 ENCOUNTER — Other Ambulatory Visit: Payer: Self-pay | Admitting: Cardiovascular Disease

## 2013-01-27 NOTE — Telephone Encounter (Signed)
Rx was sent to pharmacy electronically. 

## 2013-04-11 ENCOUNTER — Other Ambulatory Visit: Payer: Self-pay | Admitting: Cardiovascular Disease

## 2013-04-11 NOTE — Telephone Encounter (Signed)
Rx was sent to pharmacy electronically. 

## 2013-08-13 IMAGING — CR DG CERVICAL SPINE 2 OR 3 VIEWS
1 series · 1 of 1 positions shown · non-contrast
Comparison: Cervical spine MR dated 04/15/2012.

CLINICAL DATA: C5-6 anterior cervical discectomy and fusion.

CERVICAL SPINE - 2-3 VIEW

[view not recorded]
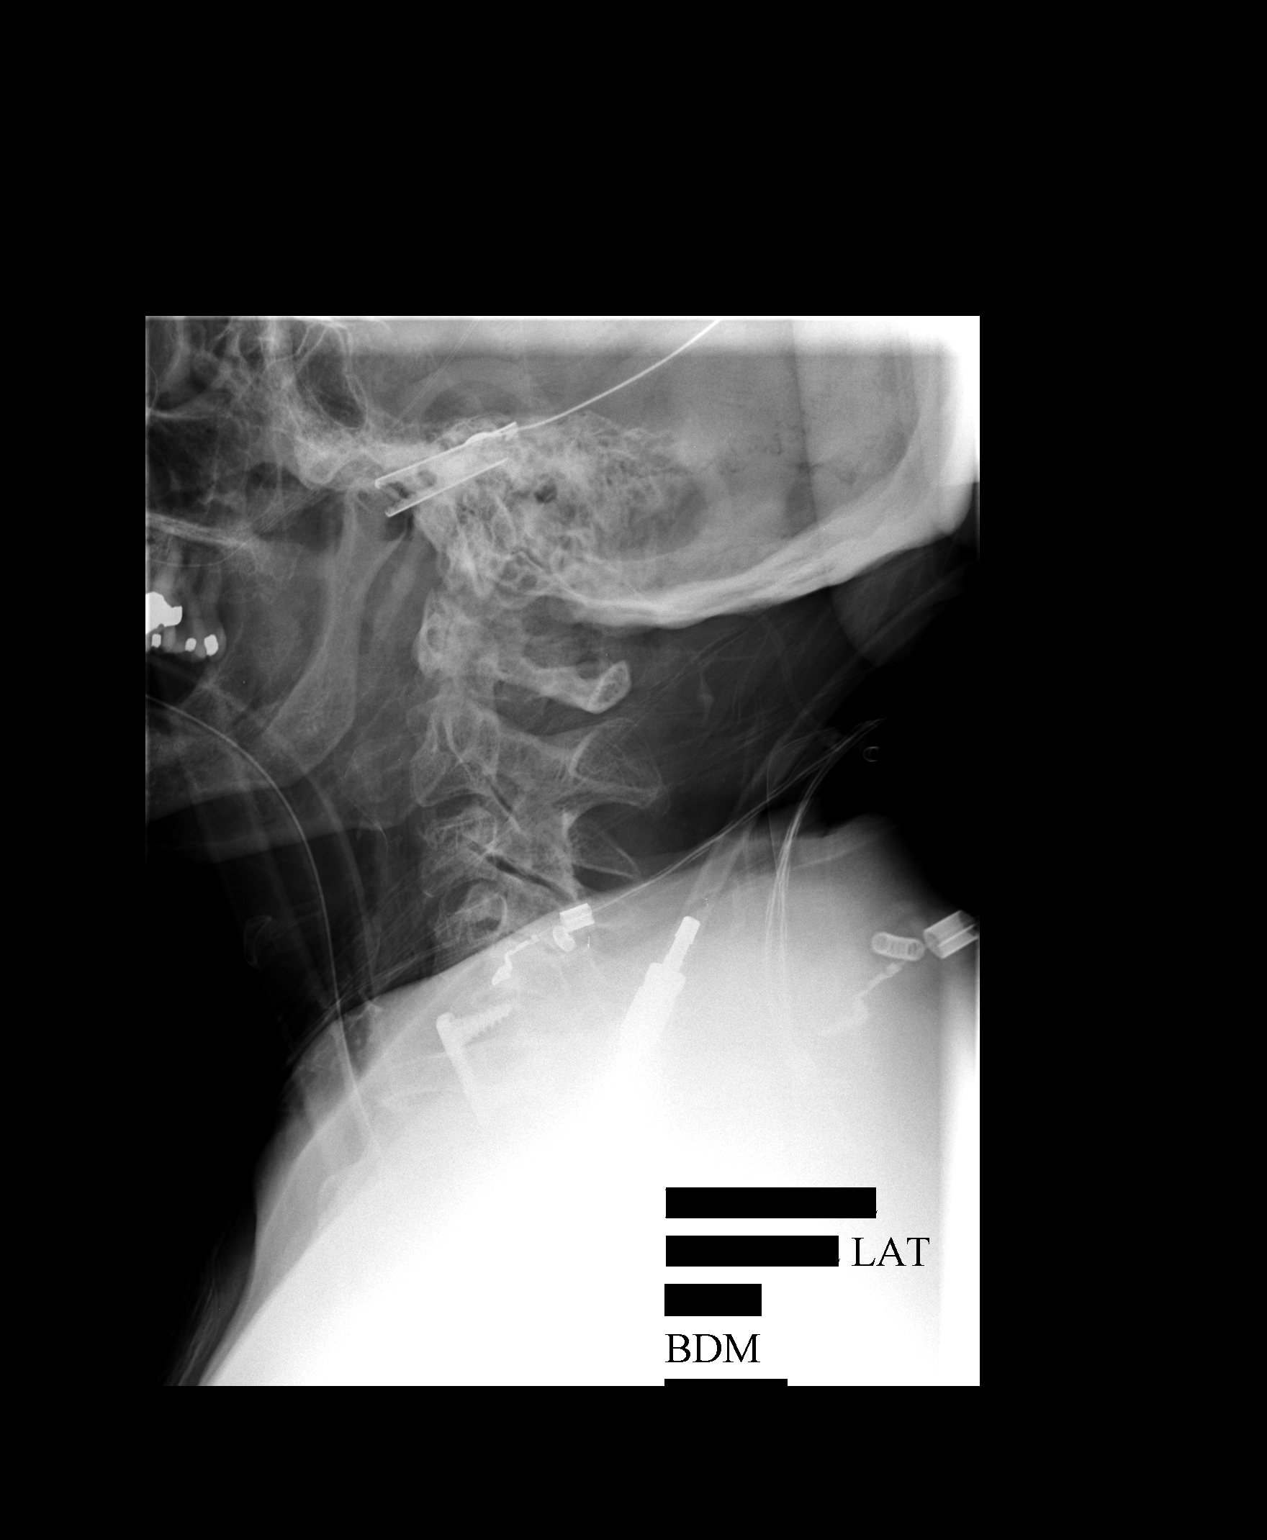

[1 of 1 positions shown; findings below may reference images not displayed]

FINDINGS: Anterior metallic localizer with its tip at approximately
the C4-5 level.  The tip and bones are obscured by overlapping of
the patient's shoulders.

A second image demonstrates interbody bone plug and anterior screw
and plate fusion at the C5-6.  Alignment cannot be assessed due to
obscuration of the hardware and bones by overlapping of the
patient's shoulders.
IMPRESSION: Anterior cervical fusion at the C5-6 level.  This is not adequately
visualized due to overlapping of the patient's shoulders.  Routine
views are recommended when possible.

## 2014-09-07 ENCOUNTER — Encounter: Payer: Self-pay | Admitting: Cardiovascular Disease

## 2022-01-06 DEATH — deceased
